# Patient Record
Sex: Male | Born: 1960
Health system: Southern US, Community
[De-identification: ages and names within clinical notes are randomized; demographics above are authoritative.]

## PROBLEM LIST (undated history)

## (undated) DIAGNOSIS — G709 Myoneural disorder, unspecified: Secondary | ICD-10-CM

## (undated) DIAGNOSIS — G4730 Sleep apnea, unspecified: Secondary | ICD-10-CM

## (undated) HISTORY — PX: KNEE ARTHROSCOPY: SUR90

## (undated) HISTORY — PX: SPINE SURGERY: SHX786

## (undated) HISTORY — DX: Myoneural disorder, unspecified: G70.9

## (undated) HISTORY — PX: COLONOSCOPY: SHX174

## (undated) HISTORY — DX: Sleep apnea, unspecified: G47.30

---

## 1998-11-15 ENCOUNTER — Encounter: Payer: Self-pay | Admitting: Neurosurgery

## 1998-11-20 ENCOUNTER — Inpatient Hospital Stay (HOSPITAL_COMMUNITY): Admission: RE | Admit: 1998-11-20 | Discharge: 1998-11-23 | Payer: Self-pay | Admitting: Neurosurgery

## 1998-11-23 ENCOUNTER — Inpatient Hospital Stay
Admission: RE | Admit: 1998-11-23 | Discharge: 1998-11-27 | Payer: Self-pay | Admitting: Physical Medicine & Rehabilitation

## 1998-11-28 ENCOUNTER — Encounter
Admission: RE | Admit: 1998-11-28 | Discharge: 1999-02-26 | Payer: Self-pay | Admitting: Physical Medicine & Rehabilitation

## 1999-03-01 ENCOUNTER — Encounter: Admission: RE | Admit: 1999-03-01 | Discharge: 1999-03-20 | Payer: Self-pay | Admitting: Orthopedic Surgery

## 1999-08-26 ENCOUNTER — Encounter: Payer: Self-pay | Admitting: Neurosurgery

## 1999-08-26 ENCOUNTER — Ambulatory Visit (HOSPITAL_COMMUNITY): Admission: RE | Admit: 1999-08-26 | Discharge: 1999-08-26 | Payer: Self-pay | Admitting: Neurosurgery

## 1999-11-16 ENCOUNTER — Ambulatory Visit (HOSPITAL_COMMUNITY): Admission: RE | Admit: 1999-11-16 | Discharge: 1999-11-16 | Payer: Self-pay

## 2006-03-18 ENCOUNTER — Encounter: Payer: Self-pay | Admitting: Neurosurgery

## 2010-12-08 ENCOUNTER — Encounter: Payer: Self-pay | Admitting: Neurosurgery

## 2011-11-10 ENCOUNTER — Ambulatory Visit (INDEPENDENT_AMBULATORY_CARE_PROVIDER_SITE_OTHER): Payer: BC Managed Care – PPO

## 2011-11-10 DIAGNOSIS — J019 Acute sinusitis, unspecified: Secondary | ICD-10-CM

## 2011-11-10 DIAGNOSIS — J209 Acute bronchitis, unspecified: Secondary | ICD-10-CM

## 2013-02-10 ENCOUNTER — Encounter: Payer: BC Managed Care – PPO | Admitting: Family Medicine

## 2013-05-11 ENCOUNTER — Other Ambulatory Visit (INDEPENDENT_AMBULATORY_CARE_PROVIDER_SITE_OTHER): Payer: BC Managed Care – PPO

## 2013-05-11 DIAGNOSIS — Z111 Encounter for screening for respiratory tuberculosis: Secondary | ICD-10-CM

## 2013-05-13 LAB — TB SKIN TEST
Induration: 0 mm
TB Skin Test: NEGATIVE

## 2015-12-02 ENCOUNTER — Ambulatory Visit (INDEPENDENT_AMBULATORY_CARE_PROVIDER_SITE_OTHER): Payer: BLUE CROSS/BLUE SHIELD | Admitting: Family Medicine

## 2015-12-02 VITALS — BP 152/90 | HR 90 | Temp 98.0°F | Resp 20 | Ht 72.44 in | Wt 264.8 lb

## 2015-12-02 DIAGNOSIS — R0683 Snoring: Secondary | ICD-10-CM

## 2015-12-02 NOTE — Progress Notes (Signed)
This chart was scribed for Robyn Haber, MD by [facility], medical scribe at [facility].The patient was seen in exam room 08 and the patient's care was started at 9:04 AM.  Patient ID: Steven Silva MRN: [id number], DOB: Feb 13, 1961, 55 y.o. Date of Encounter: 12/02/2015  Primary Physician: Crisoforo Oxford, PA-C  Chief Complaint:  Chief Complaint  Patient presents with   OTHER    lab result (sleep study)   HPI:  Steven Silva is a 55 y.o. male who presents to [facility] with concerns of possible sleep apnea. Dozes off after work around 5-6 PM. His wife has noticed that he snores and wakes during the night frequently. He does need a sleep study. He had had more acid reflux recently. Wisdom tooth extracted recently, which has caused some facial swelling and pain, taking ibuprofen for this. A sedentary job, which concerns him because of his weight gain and increased blood pressure. 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  History reviewed. No pertinent past medical history.   Home Meds: Prior to Admission medications   Not on File   Allergies: No Known Allergies  Social History   Social History   Marital Status: Single    Spouse Name: N/A   Number of Children: N/A   Years of Education: N/A   Occupational History   Not on file.   Social History Main Topics   Smoking status: Never Smoker    Smokeless tobacco: Not on file   Alcohol Use: No   Drug Use: No   Sexual Activity: Not on file   Other Topics Concern   Not on file   Social History Narrative   No narrative on file    Review of Systems: Constitutional: negative for chills, fever, night sweats, weight changes. Positive for fatigue. HEENT: negative for vision changes, hearing loss, congestion, rhinorrhea, ST, epistaxis, or sinus pressure. Cardiovascular: negative for chest pain or palpitations Respiratory: negative for  hemoptysis, wheezing, shortness of breath, or cough Abdominal: negative for abdominal pain, nausea, vomiting, diarrhea, or constipation Dermatological: negative for rash Neurologic: negative for headache, dizziness, or syncope. Positive for sleep disturbance. All other systems reviewed and are otherwise negative with the exception to those above and in the HPI.  Physical Exam: Blood pressure 152/90, pulse 90, temperature 98 F (36.7 C), temperature source Oral, resp. rate 20, height 6' 0.44" (1.84 m), weight 264 lb 12.8 oz (120.112 kg), SpO2 96 %., Body mass index is 35.48 kg/(m^2). General: Well developed, well nourished, in no acute distress. Head: Normocephalic, atraumatic, eyes without discharge, sclera non-icteric, nares are without discharge. Bilateral auditory canals clear, TM's are without perforation, pearly grey and translucent with reflective cone of light bilaterally. Oral cavity moist, posterior pharynx without exudate, erythema, peritonsillar abscess, or post nasal drip.  Neck: Supple. No thyromegaly. Full ROM. No lymphadenopathy. Lungs: Clear bilaterally to auscultation without wheezes, rales, or rhonchi. Breathing is unlabored. Heart: RRR with S1 S2. No murmurs, rubs, or gallops appreciated. Abdomen: Soft, non-tender, non-distended with normoactive bowel sounds. No hepatomegaly. No rebound/guarding. No obvious abdominal masses. Msk:  Strength and tone normal for age. Extremities/Skin: Warm and dry. No clubbing or cyanosis. No edema. No rashes or suspicious lesions. Neuro: Alert and oriented X 3. Moves all extremities spontaneously. Gait is normal. CNII-XII grossly in tact. Psych:  Responds to questions appropriately with a normal affect.    ASSESSMENT AND PLAN:  55 y.o. year old  male with   By signing my name below, I, Nadim Abuhashem, attest that this documentation has been prepared under the direction and in the presence of Robyn Haber, MD.  Electronically Signed: Lora Havens, medical scribe. 12/02/2015 9:28 AM.  This chart was scribed in my presence and reviewed by me personally.    ICD-9-CM ICD-10-CM   1. Snoring 786.09 R06.83 Split night study     Signed, Robyn Haber, MD

## 2016-01-12 ENCOUNTER — Inpatient Hospital Stay (HOSPITAL_COMMUNITY): Payer: BLUE CROSS/BLUE SHIELD | Admitting: Anesthesiology

## 2016-01-12 ENCOUNTER — Ambulatory Visit (INDEPENDENT_AMBULATORY_CARE_PROVIDER_SITE_OTHER): Payer: BLUE CROSS/BLUE SHIELD | Admitting: Emergency Medicine

## 2016-01-12 ENCOUNTER — Inpatient Hospital Stay (HOSPITAL_COMMUNITY)
Admission: EM | Admit: 2016-01-12 | Discharge: 2016-01-14 | DRG: 603 | Disposition: A | Discharge status: Home Health | Payer: BLUE CROSS/BLUE SHIELD | Attending: Surgery | Admitting: Surgery

## 2016-01-12 ENCOUNTER — Encounter (HOSPITAL_COMMUNITY): Admission: EM | Disposition: A | Discharge status: Home Health | Payer: Self-pay | Source: Home / Self Care

## 2016-01-12 ENCOUNTER — Encounter (HOSPITAL_COMMUNITY): Payer: Self-pay | Admitting: Emergency Medicine

## 2016-01-12 ENCOUNTER — Emergency Department (HOSPITAL_COMMUNITY): Payer: BLUE CROSS/BLUE SHIELD

## 2016-01-12 VITALS — BP 142/90 | HR 91 | Temp 98.7°F | Resp 12 | Ht 73.0 in | Wt 258.0 lb

## 2016-01-12 DIAGNOSIS — K612 Anorectal abscess: Secondary | ICD-10-CM | POA: Diagnosis not present

## 2016-01-12 DIAGNOSIS — L0231 Cutaneous abscess of buttock: Secondary | ICD-10-CM | POA: Diagnosis present

## 2016-01-12 DIAGNOSIS — S3662XA Contusion of rectum, initial encounter: Secondary | ICD-10-CM

## 2016-01-12 HISTORY — PX: INCISION AND DRAINAGE ABSCESS: SHX5864

## 2016-01-12 LAB — POCT CBC
Granulocyte percent: 71.6 %G (ref 37–80)
HCT, POC: 14.7 % — AB (ref 43.5–53.7)
Hemoglobin: 14.5 g/dL (ref 14.1–18.1)
Lymph, poc: 2.4 (ref 0.6–3.4)
MCH, POC: 30.7 pg (ref 27–31.2)
MCHC: 34.8 g/dL (ref 31.8–35.4)
MCV: 88.1 fL (ref 80–97)
MID (cbc): 1.2 — AB (ref 0–0.9)
MPV: 7.4 fL (ref 0–99.8)
POC Granulocyte: 9 — AB (ref 2–6.9)
POC LYMPH PERCENT: 18.9 %L (ref 10–50)
POC MID %: 9.5 %M (ref 0–12)
Platelet Count, POC: 198 10*3/uL (ref 142–424)
RBC: 4.73 M/uL (ref 4.69–6.13)
RDW, POC: 13 %
WBC: 12.6 10*3/uL — AB (ref 4.6–10.2)

## 2016-01-12 LAB — CBC WITH DIFFERENTIAL/PLATELET
BASOS ABS: 0 10*3/uL (ref 0.0–0.1)
Basophils Relative: 0 %
Eosinophils Absolute: 0.2 10*3/uL (ref 0.0–0.7)
Eosinophils Relative: 2 %
HEMATOCRIT: 40.2 % (ref 39.0–52.0)
HEMOGLOBIN: 13.8 g/dL (ref 13.0–17.0)
LYMPHS PCT: 14 %
Lymphs Abs: 1.8 10*3/uL (ref 0.7–4.0)
MCH: 30.8 pg (ref 26.0–34.0)
MCHC: 34.3 g/dL (ref 30.0–36.0)
MCV: 89.7 fL (ref 78.0–100.0)
MONO ABS: 1.2 10*3/uL — AB (ref 0.1–1.0)
MONOS PCT: 10 %
NEUTROS ABS: 9.3 10*3/uL — AB (ref 1.7–7.7)
NEUTROS PCT: 74 %
Platelets: 213 10*3/uL (ref 150–400)
RBC: 4.48 MIL/uL (ref 4.22–5.81)
RDW: 12.3 % (ref 11.5–15.5)
WBC: 12.5 10*3/uL — ABNORMAL HIGH (ref 4.0–10.5)

## 2016-01-12 LAB — BASIC METABOLIC PANEL
ANION GAP: 10 (ref 5–15)
BUN: 16 mg/dL (ref 6–20)
CALCIUM: 9 mg/dL (ref 8.9–10.3)
CHLORIDE: 104 mmol/L (ref 101–111)
CO2: 23 mmol/L (ref 22–32)
Creatinine, Ser: 1.08 mg/dL (ref 0.61–1.24)
GFR calc Af Amer: >60 mL/min (ref >60)
GFR calc non Af Amer: >60 mL/min (ref >60)
GLUCOSE: 94 mg/dL (ref 65–99)
Potassium: 3.8 mmol/L (ref 3.5–5.1)
Sodium: 137 mmol/L (ref 135–145)

## 2016-01-12 LAB — I-STAT CG4 LACTIC ACID, ED: Lactic Acid, Venous: 0.96 mmol/L (ref 0.5–2.0)

## 2016-01-12 LAB — GLUCOSE, POCT (MANUAL RESULT ENTRY): POC Glucose: 96 mg/dl (ref 70–99)

## 2016-01-12 SURGERY — INCISION AND DRAINAGE, ABSCESS
Anesthesia: General | Laterality: Left

## 2016-01-12 MED ORDER — HYDROMORPHONE HCL 1 MG/ML IJ SOLN: 0.2500 mg | INTRAMUSCULAR | Status: DC | PRN

## 2016-01-12 MED ORDER — BUPIVACAINE-EPINEPHRINE 0.25% -1:200000 IJ SOLN
INTRAMUSCULAR | Status: DC | PRN
  Administered 2016-01-12: 30 mL

## 2016-01-12 MED ORDER — ONDANSETRON HCL 4 MG/2ML IJ SOLN
INTRAMUSCULAR | Status: AC
  Filled 2016-01-12: qty 2

## 2016-01-12 MED ORDER — PIPERACILLIN-TAZOBACTAM 3.375 G IVPB
3.3750 g | Freq: Three times a day (TID) | INTRAVENOUS | Status: DC
  Administered 2016-01-12 – 2016-01-14 (×5): 3.375 g via INTRAVENOUS
  Filled 2016-01-12 (×8): qty 50

## 2016-01-12 MED ORDER — BUPIVACAINE-EPINEPHRINE (PF) 0.25% -1:200000 IJ SOLN
INTRAMUSCULAR | Status: AC
  Filled 2016-01-12: qty 30

## 2016-01-12 MED ORDER — PROPOFOL 10 MG/ML IV BOLUS
INTRAVENOUS | Status: DC | PRN
  Administered 2016-01-12: 200 mg via INTRAVENOUS
  Administered 2016-01-12: 150 mg via INTRAVENOUS

## 2016-01-12 MED ORDER — OXYCODONE HCL 5 MG PO TABS: 5.0000 mg | ORAL_TABLET | Freq: Once | ORAL | Status: DC | PRN

## 2016-01-12 MED ORDER — LACTATED RINGERS IV SOLN
INTRAVENOUS | Status: DC | PRN
  Administered 2016-01-12: 22:00:00 via INTRAVENOUS

## 2016-01-12 MED ORDER — PROPOFOL 10 MG/ML IV BOLUS
INTRAVENOUS | Status: AC
  Filled 2016-01-12: qty 20

## 2016-01-12 MED ORDER — SODIUM CHLORIDE 0.9 % IV BOLUS (SEPSIS)
1000.0000 mL | Freq: Once | INTRAVENOUS | Status: AC
  Administered 2016-01-12: 1000 mL via INTRAVENOUS

## 2016-01-12 MED ORDER — VANCOMYCIN HCL IN DEXTROSE 1-5 GM/200ML-% IV SOLN
1000.0000 mg | Freq: Once | INTRAVENOUS | Status: AC
  Administered 2016-01-12: 1000 mg via INTRAVENOUS
  Filled 2016-01-12: qty 200

## 2016-01-12 MED ORDER — FENTANYL CITRATE (PF) 100 MCG/2ML IJ SOLN
INTRAMUSCULAR | Status: AC
  Filled 2016-01-12: qty 2

## 2016-01-12 MED ORDER — 0.9 % SODIUM CHLORIDE (POUR BTL) OPTIME
TOPICAL | Status: DC | PRN
  Administered 2016-01-12: 1000 mL

## 2016-01-12 MED ORDER — PROMETHAZINE HCL 25 MG/ML IJ SOLN: 6.2500 mg | INTRAMUSCULAR | Status: DC | PRN

## 2016-01-12 MED ORDER — CEFAZOLIN SODIUM 1-5 GM-% IV SOLN
1.0000 g | Freq: Once | INTRAVENOUS | Status: AC
  Administered 2016-01-12: 1 g via INTRAVENOUS
  Filled 2016-01-12: qty 50

## 2016-01-12 MED ORDER — LIDOCAINE HCL (CARDIAC) 20 MG/ML IV SOLN
INTRAVENOUS | Status: AC
Start: 2016-01-12 — End: 2016-01-12
  Filled 2016-01-12: qty 5

## 2016-01-12 MED ORDER — FENTANYL CITRATE (PF) 100 MCG/2ML IJ SOLN
INTRAMUSCULAR | Status: DC | PRN
  Administered 2016-01-12 (×2): 50 ug via INTRAVENOUS

## 2016-01-12 MED ORDER — LIDOCAINE HCL (CARDIAC) 20 MG/ML IV SOLN
INTRAVENOUS | Status: DC | PRN
  Administered 2016-01-12: 100 mg via INTRATRACHEAL

## 2016-01-12 MED ORDER — IOHEXOL 300 MG/ML  SOLN
100.0000 mL | Freq: Once | INTRAMUSCULAR | Status: AC | PRN
  Administered 2016-01-12: 100 mL via INTRAVENOUS

## 2016-01-12 MED ORDER — OXYCODONE HCL 5 MG/5ML PO SOLN
5.0000 mg | Freq: Once | ORAL | Status: DC | PRN
  Filled 2016-01-12: qty 5

## 2016-01-12 MED ORDER — ONDANSETRON HCL 4 MG/2ML IJ SOLN
INTRAMUSCULAR | Status: DC | PRN
  Administered 2016-01-12: 4 mg via INTRAVENOUS

## 2016-01-12 SURGICAL SUPPLY — 27 items
BNDG GAUZE ELAST 4 BULKY (GAUZE/BANDAGES/DRESSINGS) IMPLANT
COVER SURGICAL LIGHT HANDLE (MISCELLANEOUS) ×3 IMPLANT
DECANTER SPIKE VIAL GLASS SM (MISCELLANEOUS) IMPLANT
DRAPE LAPAROSCOPIC ABDOMINAL (DRAPES) IMPLANT
DRSG PAD ABDOMINAL 8X10 ST (GAUZE/BANDAGES/DRESSINGS) IMPLANT
ELECT REM PT RETURN 9FT ADLT (ELECTROSURGICAL) ×3
ELECTRODE REM PT RTRN 9FT ADLT (ELECTROSURGICAL) ×1 IMPLANT
GAUZE PACKING IODOFORM 2 (PACKING) ×2 IMPLANT
GAUZE SPONGE 4X4 12PLY STRL (GAUZE/BANDAGES/DRESSINGS) ×2 IMPLANT
GLOVE BIO SURGEON STRL SZ7.5 (GLOVE) ×3 IMPLANT
GLOVE ECLIPSE 7.5 STRL STRAW (GLOVE) ×3 IMPLANT
GOWN STRL REUS W/TWL XL LVL3 (GOWN DISPOSABLE) ×6 IMPLANT
KIT BASIN OR (CUSTOM PROCEDURE TRAY) ×3 IMPLANT
NDL HYPO 25X1 1.5 SAFETY (NEEDLE) IMPLANT
NEEDLE HYPO 25X1 1.5 SAFETY (NEEDLE) IMPLANT
NS IRRIG 1000ML POUR BTL (IV SOLUTION) ×3 IMPLANT
PACK GENERAL/GYN (CUSTOM PROCEDURE TRAY) ×3 IMPLANT
SPONGE LAP 18X18 X RAY DECT (DISPOSABLE) IMPLANT
SUT MNCRL AB 4-0 PS2 18 (SUTURE) IMPLANT
SUT VIC AB 3-0 SH 27 (SUTURE)
SUT VIC AB 3-0 SH 27XBRD (SUTURE) IMPLANT
SWAB COLLECTION DEVICE MRSA (MISCELLANEOUS) IMPLANT
SWAB CULTURE ESWAB REG 1ML (MISCELLANEOUS) IMPLANT
SYR CONTROL 10ML LL (SYRINGE) IMPLANT
TAPE CLOTH SURG 4X10 WHT LF (GAUZE/BANDAGES/DRESSINGS) ×2 IMPLANT
TOWEL OR 17X26 10 PK STRL BLUE (TOWEL DISPOSABLE) ×3 IMPLANT
TOWEL OR NON WOVEN STRL DISP B (DISPOSABLE) IMPLANT

## 2016-01-12 NOTE — Progress Notes (Signed)
Pt arrived from ED.  I received report from nurse, both verbally and in a note. Nurse reported and note said pt had Vanc and Zosyn in ED.  I asked nurse when pt would be having surgery, and she said tomorrow am. Within 2 minutes of pt arriving on floor, OR called and said they were ready for the pt. They asked if the consent was in the chart, I checked and said no, but I would have pt sign it and send him down as soon as possible. About an hour later, OR nurse called and asked it pt had Vanc and Zosyn in ED. She said the pt stated he had both, but it the Zosyn was not documented on the [facility].  I told nurse I had been told verbally and in writing that he had both the Vanc and Zosyn, but she probably needed to call the ED nurse or pharmacy to be sure. A bit later, Dr. Abran Cantor called the CN Hart Carwin to inquire if the pt had in fact received the Zosyn.  She did not know anymore than I, but advised him to call the CN in ED try to see when what meds were pulled for pt since the Zosyn was not documented as given.  A while later, ED CN called to let med know that pt had in fact received Ancef while in ED, not Zosyn.

## 2016-01-12 NOTE — Patient Instructions (Signed)
I have called [facility] emergency room. Please  go there for evaluation.

## 2016-01-12 NOTE — Anesthesia Postprocedure Evaluation (Signed)
Anesthesia Post Note  Patient: Steven Silva  Procedure(s) Performed: Procedure(s) (LRB): INCISION AND DRAINAGE ABSCESS buttock abscess (Right)  Patient location during evaluation: PACU Anesthesia Type: General Level of consciousness: awake and alert Pain management: pain level controlled Vital Signs Assessment: post-procedure vital signs reviewed and stable Respiratory status: spontaneous breathing, nonlabored ventilation, respiratory function stable and patient connected to nasal cannula oxygen Cardiovascular status: blood pressure returned to baseline and stable Postop Assessment: no signs of nausea or vomiting Anesthetic complications: no    Last Vitals:  Filed Vitals:   01/12/16 2245 01/12/16 2300  BP: 139/92 145/97  Pulse: 78 76  Temp: 36.7 C   Resp: 15 12    Last Pain:  Filed Vitals:   01/12/16 2302  PainSc: 1                  Zenaida Deed

## 2016-01-12 NOTE — Op Note (Signed)
Preoperative Diagnosis: Left buttock abscess  Postoprative Diagnosis: Same   Procedure: Procedure(s): INCISION AND DRAINAGE ABSCESS left buttock   Surgeon: [name] T   Assistants: none  Anesthesia:  General endotracheal anesthesia  Indications: patient is a 55 year old male who injured his left buttock sitting on a liquid rail fence about 4 days prior to presentation. Today he noted swelling and foul-smelling drainage and presented for evaluation. He is noted to have evidence of a soft tissue infection in the left buttock near the anus and CT scan shows a large collection of gas and fluid in the left buttock. I recommended emergency incision and drainage and the patient is in agreement.  Procedure Detail:  Patient was brought to the operating room, placed in supine position on the operating table, and general endotracheal anesthesia induced. He was carefully positioned in lithotomy position. The buttocks were widely sterilely prepped and draped. Patient timeout was performed. Procedure verified. He was already on broad-spectrum IV antibiotics. He was noted to be about a 1 cm wound in the left buttock several centimeters from the anus and just posterior. There was foul-smelling reddish purulent drainage. This wound was dilated and further drainage obtained and cultured. Digital palpation revealed a cavity extending a significant distance up along the left perirectal space. This wound was extended out laterally and opened up with cautery. The cavity was opened as much as possible. It actually extended about 12 cm proximally along the perirectal space. A moderate amount of very foul-smelling drainage was obtained. All loculations were broken up. I did not see any necrotic tissue from what I was able to examine through the wound. Following this the wound was thoroughly irrigated. The cavity was then packed with 5 yards of 1 inch iodoform gauze. There was no bleeding. The soft tissue was  infiltrated with Marcaine. Sponge needle and instrument counts were correct. Clean gauze dressing was applied.    Findings: As above  Estimated Blood Loss:  Minimal         Drains: wound packed open with one-inch iodoform gauze  Blood Given: none          Specimens: culture and sensitivity        Complications:  * No complications entered in OR log *         Disposition: PACU - hemodynamically stable.         Condition: stable

## 2016-01-12 NOTE — Anesthesia Procedure Notes (Signed)
Procedure Name: LMA Insertion Date/Time: 01/12/2016 10:00 PM Performed by: [place] Quinnetta Roepke C Pre-anesthesia Checklist: Patient identified, Timeout performed, Emergency Drugs available, Suction available and Patient being monitored Patient Re-evaluated:Patient Re-evaluated prior to inductionOxygen Delivery Method: Circle system utilized Intubation Type: IV induction LMA: LMA inserted LMA Size: 5.0 Number of attempts: 1 Placement Confirmation: positive ETCO2,  breath sounds checked- equal and bilateral and CO2 detector Tube secured with: Tape Dental Injury: Teeth and Oropharynx as per pre-operative assessment

## 2016-01-12 NOTE — Transfer of Care (Signed)
Immediate Anesthesia Transfer of Care Note  Patient: Steven Silva  Procedure(s) Performed: Procedure(s): INCISION AND DRAINAGE ABSCESS buttock abscess (Right)  Patient Location: PACU  Anesthesia Type:General  Level of Consciousness: awake, alert  and oriented  Airway & Oxygen Therapy: Patient Spontanous Breathing and Patient connected to face mask oxygen  Post-op Assessment: Report given to RN and Post -op Vital signs reviewed and stable  Post vital signs: Reviewed and stable  Last Vitals:  Filed Vitals:   01/12/16 1957 01/12/16 2110  BP: 137/88 126/80  Pulse: 80 82  Temp:    Resp: 18 20    Complications: No apparent anesthesia complications

## 2016-01-12 NOTE — ED Provider Notes (Signed)
CSN: [id number]     Arrival date & time 01/12/16  1444 History   First MD Initiated Contact with Patient 01/12/16 1526     Chief Complaint  Patient presents with  . Abscess  . Rectal Pain   LAVARIS MELLY is a 55 y.o. male Who presents to the emergency department complaining of an abscess to his left buttocks for the past 5 days. She reports that 5 days ago he tripped and fell backwards onto a fence and sustained an injury to his left buttocks. He reports he has developed pain, swelling and discharge from the area. He reports it has been weeping. He reports fever last night with a temperature of 102 at home. He denies any current pain. He has had nothing for treatment today. He saw his primary care provider Dr. Everlene Farrier who sent him to the emergency department. The patient had an elevated white count on CBC at his primary care provider's office. Patient reports last tetanus was 3 years ago. The patient denies abdominal pain, nausea, vomiting, diarrhea, hematochezia, cough, shortness of breath, urinary symptoms, back pain, numbness, tingling or weakness.  The history is provided by the patient. No language interpreter was used.    History reviewed. No pertinent past medical history. Past Surgical History  Procedure Laterality Date  . Spine surgery     History reviewed. No pertinent family history. Social History  Substance Use Topics  . Smoking status: Never Smoker   . Smokeless tobacco: None  . Alcohol Use: No    Review of Systems  Constitutional: Positive for fever and chills.  HENT: Negative for sore throat.   Eyes: Negative for visual disturbance.  Respiratory: Negative for cough and shortness of breath.   Cardiovascular: Negative for chest pain.  Gastrointestinal: Negative for nausea, vomiting, abdominal pain and diarrhea.  Genitourinary: Negative for dysuria, urgency, hematuria and difficulty urinating.  Musculoskeletal: Negative for back pain and neck pain.  Skin: Positive for  color change, rash and wound.  Neurological: Negative for weakness, numbness and headaches.      Allergies  Review of patient's allergies indicates no known allergies.  Home Medications   Prior to Admission medications   Not on File   BP 128/78 mmHg  Pulse 80  Temp(Src) 98.2 F (36.8 C) (Oral)  Resp 14  Ht 6\' 1"  (1.854 m)  Wt 115.214 kg  BMI 33.52 kg/m2  SpO2 95% Physical Exam  Constitutional: He appears well-developed and well-nourished. No distress.  HENT:  Head: Normocephalic and atraumatic.  Mouth/Throat: Oropharynx is clear and moist.  Eyes: Right eye exhibits no discharge. Left eye exhibits no discharge.  Neck: Neck supple.  Pulmonary/Chest: Effort normal and breath sounds normal. No respiratory distress.  Abdominal: Soft. There is no tenderness.  Genitourinary:  Large fluctuant abscess to his left buttocks that extends up to his gluteal cleft. with some malodorus purulent drainage with surrounding cellulitis and ecchymosis. See picture in chart.   Musculoskeletal: He exhibits no edema.  Lymphadenopathy:    He has no cervical adenopathy.  Neurological: He is alert. Coordination normal.  Skin: Skin is warm and dry. No rash noted. He is not diaphoretic. There is erythema. No pallor.  Psychiatric: He has a normal mood and affect. His behavior is normal.  Nursing note and vitals reviewed.     ED Course  Procedures (including critical care time) Labs Review Labs Reviewed  CBC WITH DIFFERENTIAL/PLATELET - Abnormal; Notable for the following:    WBC 12.5 (*)  Neutro Abs 9.3 (*)    Monocytes Absolute 1.2 (*)    All other components within normal limits  CULTURE, BLOOD (ROUTINE X 2)  CULTURE, BLOOD (ROUTINE X 2)  CULTURE, ROUTINE-ABSCESS  BASIC METABOLIC PANEL  I-STAT CG4 LACTIC ACID, ED    Imaging Review Ct Pelvis W Contrast  01/12/2016  CLINICAL DATA:  Buttock abscess. Set on metal thin seen Tuesday. Swelling and drainage. EXAM: CT PELVIS WITH CONTRAST  TECHNIQUE: Multidetector CT imaging of the pelvis was performed using the standard protocol following the bolus administration of intravenous contrast. CONTRAST:  140mL OMNIPAQUE IOHEXOL 300 MG/ML  SOLN COMPARISON:  None. FINDINGS: There is gas and soft tissue stranding noted within the right medial buttock subcutaneous soft tissues near the anus and rectum. This is not well-defined but the and flank area measures approximately 7 x 4.6 cm. Visualized large and small bowel are unremarkable. Aorta and iliac vessels are normal caliber. Urinary bladder is unremarkable. No free fluid, free air or adenopathy. No acute bony abnormality. Degenerative changes in the lower lumbar spine. IMPRESSION: Inflammatory stranding and locules of gas noted within the medial left buttock subcutaneous soft tissues near the anus rectum. No fluid within this area and no well-defined walls. This inflamed area measures up to approximately 7 cm. Electronically Signed   By: Rolm Baptise M.D.   On: 01/12/2016 17:49   I have personally reviewed and evaluated these images and lab results as part of my medical decision-making.   EKG Interpretation None      Filed Vitals:   01/12/16 2342 01/13/16 0046 01/13/16 0149 01/13/16 0246  BP: 132/87 124/89 147/81 128/78  Pulse: 77 77 80 80  Temp: 98.5 F (36.9 C) 98.2 F (36.8 C) 98.2 F (36.8 C) 98.2 F (36.8 C)  TempSrc:  Oral Oral Oral  Resp: 16 14 14 14   Height:  6\' 1"  (1.854 m)    Weight:  115.214 kg    SpO2: 98% 98% 98% 95%     MDM   Meds given in ED:  Medications  morphine 2 MG/ML injection 2-4 mg (not administered)  piperacillin-tazobactam (ZOSYN) IVPB 3.375 g ( Intravenous MAR Unhold 01/12/16 2353)  oxyCODONE-acetaminophen (PERCOCET/ROXICET) 5-325 MG per tablet 1-2 tablet (not administered)  vancomycin (VANCOCIN) IVPB 1000 mg/200 mL premix (0 mg Intravenous Stopped 01/12/16 1900)  ceFAZolin (ANCEF) IVPB 1 g/50 mL premix (0 g Intravenous Stopped 01/12/16 1700)  sodium  chloride 0.9 % bolus 1,000 mL (0 mLs Intravenous Stopped 01/12/16 1728)  iohexol (OMNIPAQUE) 300 MG/ML solution 100 mL (100 mLs Intravenous Contrast Given 01/12/16 1729)    There are no discharge medications for this patient.   Final diagnoses:  Abscess of buttock, left   This is a 55 y.o. male Who presents to the emergency department complaining of an abscess to his left buttocks for the past 5 days. She reports that 5 days ago he tripped and fell backwards onto a fence and sustained an injury to his left buttocks. He reports he has developed pain, swelling and discharge from the area. He reports it has been weeping. He reports fever last night with a temperature of 102 at home. On exam the patient is afebrile nontoxic appearing. He has a large indurated fluctuant abscess to his left buttocks. This is weeping some brown purulent material. Will obtain CT pelvis, then you start on IV antibiotics and obtain blood work and blood cultures. Patient with a leukocytosis with a white count of 12,000. CT pelvis with contrast indicated inflammatory  stranding and locules of gas noted within the medial left buttocks subcutaneous soft tissues near the anus and rectum. The inflamed areas approximately 7 cm. I consulted with general surgeon Dr. [name] who plans to take the patient to the OR for incision and drainage and washout admission.  Patient to OR with Dr. [name].      Waynetta Pean, PA-C 01/13/16 0300  Lacretia Leigh, MD 01/13/16 [phone]

## 2016-01-12 NOTE — ED Notes (Signed)
General surg at bedside

## 2016-01-12 NOTE — Progress Notes (Signed)
Chief Complaint:  Chief Complaint  Patient presents with  . Abrasion    sat on a fence on tuesday, started having drainage this morning, fever on friday    HPI: Steven Silva is a 55 y.o. male who is here for check of his left buttocks. Apparently on Tuesday night patient slipped and fell back landing on a metal portion of a fence. This punctured through his jeans and underwear. He had some bleeding at the time but did not seek medical attention.Marland Kitchen He did not seek treatment for this.He  is up-to-date on tetanus yesterday evening he noticed increasing pain started noticing some drainage from the area. This morning her has been an odor to the drainage.  History reviewed. No pertinent past medical history. Past Surgical History  Procedure Laterality Date  . Spine surgery     Social History   Social History  . Marital Status: Single    Spouse Name: N/A  . Number of Children: N/A  . Years of Education: N/A   Social History Main Topics  . Smoking status: Never Smoker   . Smokeless tobacco: None  . Alcohol Use: No  . Drug Use: No  . Sexual Activity: Not Asked   Other Topics Concern  . None   Social History Narrative   History reviewed. No pertinent family history. No Known Allergies Prior to Admission medications   Not on File     ROS: The patient denies fevers, chills, night sweats, unintentional weight loss, chest pain, palpitations, wheezing, dyspnea on exertion, nausea, vomiting, abdominal pain, dysuria, hematuria, melena, numbness, weakness, or tingling.   All other systems have been reviewed and were otherwise negative with the exception of those mentioned in the HPI and as above.    PHYSICAL EXAM: Filed Vitals:   01/12/16 1234  BP: 142/90  Pulse: 91  Temp: 98.7 F (37.1 C)  Resp: 12   Filed Vitals:   01/12/16 1234  Height: 6\' 1"  (1.854 m)  Weight: 258 lb (117.028 kg)   Body mass index is 34.05 kg/(m^2).   General: Alert, no acute  distress HEENT:  Normocephalic, atraumatic, oropharynx patent. EOMI, PERRLA Cardiovascular:  Regular rate and rhythm, no rubs murmurs or gallops.  No Carotid bruits, radial pulse intact. No pedal edema.  Respiratory: Clear to auscultation bilaterally.  No wheezes, rales, or rhonchi.  No cyanosis, no use of accessory musculature GI: No organomegaly, abdomen is soft and non-tender, positive bowel sounds.  No masses. Skin: No rashes. Neurologic: Facial musculature symmetric. Psychiatric: Patient is appropriate throughout our interaction. Lymphatic: No cervical lymphadenopathy Musculoskeletal: Gait intact. Lateral to the anus there is a 3 cm laceration with purulent drainage. Surrounding this wound is approximately 6-8 cm of indurated tissue with bruising.  LABS: Results for orders placed or performed in visit on 01/12/16  POCT CBC  Result Value Ref Range   WBC 12.6 (A) 4.6 - 10.2 K/uL   Lymph, poc 2.4 0.6 - 3.4   POC LYMPH PERCENT 18.9 10 - 50 %L   MID (cbc) 1.2 (A) 0 - 0.9   POC MID % 9.5 0 - 12 %M   POC Granulocyte 9.0 (A) 2 - 6.9   Granulocyte percent 71.6 37 - 80 %G   RBC 4.73 4.69 - 6.13 M/uL   Hemoglobin 14.5 14.1 - 18.1 g/dL   HCT, POC 14.7 (A) 43.5 - 53.7 %   MCV 88.1 80 - 97 fL   MCH, POC 30.7 27 - 31.2 pg  MCHC 34.8 31.8 - 35.4 g/dL   RDW, POC 13.0 %   Platelet Count, POC 198 142 - 424 K/uL   MPV 7.4 0 - 99.8 fL  POCT glucose (manual entry)  Result Value Ref Range   POC Glucose 96 70 - 99 mg/dl     EKG/XRAY:   Primary read interpreted by Dr. Everlene Farrier at [facility].   ASSESSMENT/PLAN: 1. Hematoma of the left medial buttocks, initial encounter Patient will be referred to [facility] emergency room for CT scanning of the pelvis, IV antibiotics, and surgical evaluation. - POCT CBC - POCT glucose (manual entry) - Wound culture  2. Abscess of anal and rectal regions See above patient states he did have a tetanus vaccination 3 years ago but it is not in the record. - POCT  CBC - POCT glucose (manual entry) - Wound culture     Gross sideeffects, risk and benefits, and alternatives of medications d/w patient. Patient is aware that all medications have potential sideeffects and we are unable to predict every sideeffect or drug-drug interaction that may occur.  @[facility] @ 01/12/2016 1:28 PM

## 2016-01-12 NOTE — ED Notes (Signed)
Patient transported to CT 

## 2016-01-12 NOTE — Anesthesia Preprocedure Evaluation (Addendum)
Anesthesia Evaluation  Patient identified by MRN, date of birth, ID band Patient awake    Reviewed: Allergy & Precautions, H&P , NPO status , Patient's Chart, lab work & pertinent test results  History of Anesthesia Complications Negative for: history of anesthetic complications  Airway Mallampati: II  TM Distance: >3 FB Neck ROM: full    Dental no notable dental hx.    Pulmonary neg pulmonary ROS,    Pulmonary exam normal breath sounds clear to auscultation       Cardiovascular negative cardio ROS Normal cardiovascular exam Rhythm:regular Rate:Normal     Neuro/Psych negative neurological ROS     GI/Hepatic negative GI ROS, Neg liver ROS,   Endo/Other  obesity  Renal/GU negative Renal ROS     Musculoskeletal   Abdominal   Peds  Hematology negative hematology ROS (+)   Anesthesia Other Findings Previously had cervical fusion, does have good range of motion with extension, good mouth opening and thyro mental distance  Reproductive/Obstetrics negative OB ROS                            Anesthesia Physical Anesthesia Plan  ASA: II  Anesthesia Plan: General   Post-op Pain Management:    Induction: Intravenous  Airway Management Planned: Oral ETT  Additional Equipment:   Intra-op Plan:   Post-operative Plan: Extubation in OR  Informed Consent: I have reviewed the patients History and Physical, chart, labs and discussed the procedure including the risks, benefits and alternatives for the proposed anesthesia with the patient or authorized representative who has indicated his/her understanding and acceptance.   Dental Advisory Given  Plan Discussed with: Anesthesiologist, CRNA and Surgeon  Anesthesia Plan Comments:         Anesthesia Quick Evaluation

## 2016-01-12 NOTE — ED Notes (Signed)
Pt sat back on piece of metal fencing on Tuesday. Thought it was just an abrasion, but swelling and drainage got worse. Went to UC who found a 3" laceration and an orange sized abscess. Pt reports abscess is close to rectal area. Pt reports UC sent off a wound culture, last tetanus 3 years ago.

## 2016-01-12 NOTE — ED Notes (Signed)
Patient presents to ED after falling on a fence late Tuesday night. Patient went to PCP for evaluation of injury today and was referred to ED due to infection. Patient reports increased smell and "weeping" that is a "reddish-brown in color".

## 2016-01-12 NOTE — H&P (Signed)
Steven Silva is an 55 y.o. male.   Chief Complaint: Buttock injury and drainage  HPI: patient states  That 4 days ago he sat down hard on a wooden branch and noticed that he injured his right buttock.He initially noted some bleeding but cleaned and bandaged. Area and it seemed okay. He has not really had much pain. However he began to have very foul smelling reddish brown drainage from the wound today. He was seen and evaluated at urgent care and sent to the emergency department. CT scan was obtained as below. He denies fever or chills. Minimal discomfort.  History reviewed. No pertinent past medical history.  Past Surgical History  Procedure Laterality Date  . Spine surgery      History reviewed. No pertinent family history. Social History:  reports that he has never smoked. He does not have any smokeless tobacco history on file. He reports that he does not drink alcohol or use illicit drugs.  Allergies: No Known Allergies  No current facility-administered medications for this encounter.   No current outpatient prescriptions on file.     Results for orders placed or performed during the hospital encounter of 01/12/16 (from the past 48 hour(s))  CBC with Differential     Status: Abnormal   Collection Time: 01/12/16  4:16 PM  Result Value Ref Range   WBC 12.5 (H) 4.0 - 10.5 K/uL   RBC 4.48 4.22 - 5.81 MIL/uL   Hemoglobin 13.8 13.0 - 17.0 g/dL   HCT 40.2 39.0 - 52.0 %   MCV 89.7 78.0 - 100.0 fL   MCH 30.8 26.0 - 34.0 pg   MCHC 34.3 30.0 - 36.0 g/dL   RDW 12.3 11.5 - 15.5 %   Platelets 213 150 - 400 K/uL   Neutrophils Relative % 74 %   Neutro Abs 9.3 (H) 1.7 - 7.7 K/uL   Lymphocytes Relative 14 %   Lymphs Abs 1.8 0.7 - 4.0 K/uL   Monocytes Relative 10 %   Monocytes Absolute 1.2 (H) 0.1 - 1.0 K/uL   Eosinophils Relative 2 %   Eosinophils Absolute 0.2 0.0 - 0.7 K/uL   Basophils Relative 0 %   Basophils Absolute 0.0 0.0 - 0.1 K/uL  Basic metabolic panel     Status: None    Collection Time: 01/12/16  4:16 PM  Result Value Ref Range   Sodium 137 135 - 145 mmol/L   Potassium 3.8 3.5 - 5.1 mmol/L   Chloride 104 101 - 111 mmol/L   CO2 23 22 - 32 mmol/L   Glucose, Bld 94 65 - 99 mg/dL   BUN 16 6 - 20 mg/dL   Creatinine, Ser 1.08 0.61 - 1.24 mg/dL   Calcium 9.0 8.9 - 10.3 mg/dL   GFR calc non Af Amer >60 >60 mL/min   GFR calc Af Amer >60 >60 mL/min    Comment: (NOTE) The eGFR has been calculated using the CKD EPI equation. This calculation has not been validated in all clinical situations. eGFR's persistently <60 mL/min signify possible Chronic Kidney Disease.    Anion gap 10 5 - 15  I-Stat CG4 Lactic Acid, ED     Status: None   Collection Time: 01/12/16  4:32 PM  Result Value Ref Range   Lactic Acid, Venous 0.96 0.5 - 2.0 mmol/L   Ct Pelvis W Contrast  01/12/2016  CLINICAL DATA:  Buttock abscess. Set on metal thin seen Tuesday. Swelling and drainage. EXAM: CT PELVIS WITH CONTRAST TECHNIQUE: Multidetector CT imaging  of the pelvis was performed using the standard protocol following the bolus administration of intravenous contrast. CONTRAST:  151m OMNIPAQUE IOHEXOL 300 MG/ML  SOLN COMPARISON:  None. FINDINGS: There is gas and soft tissue stranding noted within the right medial buttock subcutaneous soft tissues near the anus and rectum. This is not well-defined but the and flank area measures approximately 7 x 4.6 cm. Visualized large and small bowel are unremarkable. Aorta and iliac vessels are normal caliber. Urinary bladder is unremarkable. No free fluid, free air or adenopathy. No acute bony abnormality. Degenerative changes in the lower lumbar spine. IMPRESSION: Inflammatory stranding and locules of gas noted within the medial left buttock subcutaneous soft tissues near the anus rectum. No fluid within this area and no well-defined walls. This inflamed area measures up to approximately 7 cm. Electronically Signed   By: KRolm BaptiseM.D.   On: 01/12/2016 17:49     Review of Systems  Constitutional: Negative for fever and chills.  Respiratory: Negative for shortness of breath.   Cardiovascular: Negative for chest pain and palpitations.  Gastrointestinal: Negative for nausea, vomiting and abdominal pain.    Blood pressure 129/83, pulse 81, temperature 98.2 F (36.8 C), temperature source Oral, resp. rate 18, SpO2 98 %. Physical Exam   general: Alert and comfortable, no distress HEENT: Sclerae nonictericpupils reactive Lungs: No wheezing or increased work of breathing Cardiac: Regular rate and rhythm. No edema Abdomen: Soft and nontender Rectal: In the right perirectal space is about a 1.5 cm wound There is significant surrounding induration and erythema. With pressure there is very foul-smelling reddish-brown drainage.  Not really tender. Neurologic: Alert and fully oriented.Upon process normal.   Assessment/Plan Recent buttock injury and  Now evidence of possibly anaerobic infection and CT scan showing significant gas pocket in the perirectal space.This could be a coincidental perirectal abscess to his injury. He needs I believe this needs to be opened up and debrided and drained in the operating room.  This was discussed with the patient who is in agreement. Discussed the nature of the surgery and risks of anesthesia and bleeding.He will be started on broad-spectrum IV antibiotics.  HEdward Jolly MD 01/12/2016, 7:08 PM

## 2016-01-13 ENCOUNTER — Encounter (HOSPITAL_COMMUNITY): Payer: Self-pay | Admitting: *Deleted

## 2016-01-13 MED ORDER — DOCUSATE SODIUM 100 MG PO CAPS
100.0000 mg | ORAL_CAPSULE | Freq: Two times a day (BID) | ORAL | Status: DC
  Administered 2016-01-13 – 2016-01-14 (×2): 100 mg via ORAL
  Filled 2016-01-13 (×3): qty 1

## 2016-01-13 MED ORDER — OXYCODONE-ACETAMINOPHEN 5-325 MG PO TABS
1.0000 | ORAL_TABLET | ORAL | Status: DC | PRN
Start: 2016-01-13 — End: 2016-01-14
  Administered 2016-01-13 (×2): 1 via ORAL
  Administered 2016-01-14: 2 via ORAL
  Filled 2016-01-13 (×2): qty 2
  Filled 2016-01-13: qty 1

## 2016-01-13 MED ORDER — MORPHINE SULFATE (PF) 2 MG/ML IV SOLN: 2.0000 mg | INTRAVENOUS | Status: DC | PRN

## 2016-01-13 NOTE — Progress Notes (Signed)
Patient ID: Steven Silva, male   DOB: 1961/06/05, 55 y.o.   MRN: [id number] 1 Day Post-Op  Subjective:  Just sore, no other C/O  Vital signs in last 24 hours: Temp:  [98.1 F (36.7 C)-98.7 F (37.1 C)] 98.7 F (37.1 C) (02/26 0603) Pulse Rate:  [74-97] 86 (02/26 0603) Resp:  [12-20] 16 (02/26 0603) BP: (124-155)/(78-97) 126/82 mmHg (02/26 0603) SpO2:  [95 %-100 %] 95 % (02/26 0603) Weight:  [115.214 kg (254 lb)-117.028 kg (258 lb)] 115.214 kg (254 lb) (02/26 0046) Last BM Date: 01/09/16  Intake/Output from previous day: 02/25 0701 - 02/26 0700 In: 450 [I.V.:400; IV Piggyback:50] Out: 455 [Urine:450; Blood:5] Intake/Output this shift:    General appearance: alert, cooperative and no distress Incision/Wound: Moderate bloody drainage on the bandage. 5-10 cm area of erythema affect are unchanged.  Lab Results:   Recent Labs  01/12/16 1333 01/12/16 1616  WBC 12.6* 12.5*  HGB 14.5 13.8  HCT 14.7* 40.2  PLT  --  213   BMET  Recent Labs  01/12/16 1616  NA 137  K 3.8  CL 104  CO2 23  GLUCOSE 94  BUN 16  CREATININE 1.08  CALCIUM 9.0     Studies/Results: Ct Pelvis W Contrast  01/12/2016  CLINICAL DATA:  Buttock abscess. Set on metal thin seen Tuesday. Swelling and drainage. EXAM: CT PELVIS WITH CONTRAST TECHNIQUE: Multidetector CT imaging of the pelvis was performed using the standard protocol following the bolus administration of intravenous contrast. CONTRAST:  15mL OMNIPAQUE IOHEXOL 300 MG/ML  SOLN COMPARISON:  None. FINDINGS: There is gas and soft tissue stranding noted within the right medial buttock subcutaneous soft tissues near the anus and rectum. This is not well-defined but the and flank area measures approximately 7 x 4.6 cm. Visualized large and small bowel are unremarkable. Aorta and iliac vessels are normal caliber. Urinary bladder is unremarkable. No free fluid, free air or adenopathy. No acute bony abnormality. Degenerative changes in the lower  lumbar spine. IMPRESSION: Inflammatory stranding and locules of gas noted within the medial left buttock subcutaneous soft tissues near the anus rectum. No fluid within this area and no well-defined walls. This inflamed area measures up to approximately 7 cm. Electronically Signed   By: Rolm Baptise M.D.   On: 01/12/2016 17:49    Anti-infectives: Anti-infectives    Start     Dose/Rate Route Frequency Ordered Stop   01/12/16 2200  piperacillin-tazobactam (ZOSYN) IVPB 3.375 g     3.375 g 12.5 mL/hr over 240 Minutes Intravenous 3 times per day 01/12/16 1924     01/12/16 1615  vancomycin (VANCOCIN) IVPB 1000 mg/200 mL premix     1,000 mg 200 mL/hr over 60 Minutes Intravenous  Once 01/12/16 1603 01/12/16 1900   01/12/16 1615  ceFAZolin (ANCEF) IVPB 1 g/50 mL premix     1 g 100 mL/hr over 30 Minutes Intravenous  Once 01/12/16 1603 01/12/16 1700      Assessment/Plan: s/p Procedure(s): INCISION AND DRAINAGE ABSCESS buttock abscess  stable postoperatively. Continue IV antibiotics. Plan remove packing tomorrow.    LOS: 1 day    Shirel Mallis T 01/13/2016

## 2016-01-14 ENCOUNTER — Encounter (HOSPITAL_COMMUNITY): Payer: Self-pay | Admitting: General Surgery

## 2016-01-14 LAB — WOUND CULTURE

## 2016-01-14 LAB — CBC
HCT: 38.9 % — ABNORMAL LOW (ref 39.0–52.0)
Hemoglobin: 12.7 g/dL — ABNORMAL LOW (ref 13.0–17.0)
MCH: 30.1 pg (ref 26.0–34.0)
MCHC: 32.6 g/dL (ref 30.0–36.0)
MCV: 92.2 fL (ref 78.0–100.0)
PLATELETS: 223 10*3/uL (ref 150–400)
RBC: 4.22 MIL/uL (ref 4.22–5.81)
RDW: 12.6 % (ref 11.5–15.5)
WBC: 8 10*3/uL (ref 4.0–10.5)

## 2016-01-14 MED ORDER — OXYCODONE-ACETAMINOPHEN 5-325 MG PO TABS: 1.0000 | ORAL_TABLET | ORAL | Status: DC | PRN

## 2016-01-14 MED ORDER — DOCUSATE SODIUM 100 MG PO CAPS: 100.0000 mg | ORAL_CAPSULE | Freq: Two times a day (BID) | ORAL | Status: DC

## 2016-01-14 MED ORDER — AMOXICILLIN-POT CLAVULANATE 875-125 MG PO TABS: 1.0000 | ORAL_TABLET | Freq: Two times a day (BID) | ORAL | Status: DC

## 2016-01-14 NOTE — Care Management Note (Signed)
Case Management Note  Patient Details  Name: Steven Silva MRN: [id number] Date of Birth: [birth year]  Subjective/Objective:      Admitted s/p I&D buttock abscess              Action/Plan: Discharge planning, spoke with patient at bedside. Discussed need for [facility] services for wound care, patient understands and agrees. Patient states wife or mother would be teachable care giver. No preference for [facility] agency, contacted [facility] for referral. Anticipate d/c today.   Expected Discharge Date:  01/14/16               Expected Discharge Plan:  [place]  In-House Referral:  NA  Discharge planning Services  CM Consult  Post Acute Care Choice:  Home Health Choice offered to:  Patient  DME Arranged:  N/A DME Agency:  NA  HH Arranged:  RN [place] Agency:  [place], [place]  Status of Service:  Completed, signed off  Medicare Important Message Given:    Date Medicare IM Given:    Medicare IM give by:    Date Additional Medicare IM Given:    Additional Medicare Important Message give by:     If discussed at [place] of Stay Meetings, dates discussed:    Additional Comments:  Guadalupe Maple, RN 01/14/2016, 10:59 AM

## 2016-01-14 NOTE — Discharge Instructions (Signed)

## 2016-01-14 NOTE — Progress Notes (Signed)
Patient is alert and oriented, discharge instructions reviewed with patient and spouse, home health arranged, questions and concerns answered, prescription given Neta Mends RN 1:22 PM

## 2016-01-14 NOTE — Discharge Summary (Signed)
Physician Discharge Summary  Steven Silva [id number] DOB: 02-Jan-1961 DOA: 01/12/2016  PCP: Crisoforo Oxford, PA-C  Consultation: none  Admit date: 01/12/2016 Discharge date: 01/14/2016  Recommendations for Outpatient Follow-up:   Follow-up Information    Follow up with [facility] On 01/23/2016.   Specialty:  General Surgery   Why:  arrive by 8AM for a 8:30AM post op check   Contact information:   [place] [address] [phone]      Discharge Diagnoses:  1. Perirectal abscess   Surgical Procedure: I&D of perirectal abscess---Dr. [name] 01/12/16  Discharge Condition: stable3 Disposition: home  Diet recommendation: regular  Filed Weights   01/13/16 0046  Weight: 115.214 kg (254 lb)     Filed Vitals:   01/13/16 2130 01/14/16 0628  BP: 121/70 121/85  Pulse: 83 76  Temp: 98.3 F (36.8 C) 98.2 F (36.8 C)  Resp: 18 18     Hospital Course:  Steven Silva is a 55 year old male who sat down on a fence about 4 days prior to ED visit and punctured his buttock.  He presented to [facility] with foul smelling drainage.  CT demonstrated significant gas pocket in the perirectal space.This could be a coincidental perirectal abscess to his injury.  He was admitted and underwent the procedure listed above. He tolerated the procedure well and was transferred to the floor.  On POD#2 packing was removed, no surrounding cellulitis or induration.  Wound was clean without necrotic tissue.  He was tolerating the dressing changes with PO pain meds.  WBC normalized and afebrile.  He was therefore felt stable for discharge home. Medication risks, benefits and therapeutic alternatives were reviewed with the patient.  He verbalizes understanding.  Follow up has been scheduled for a wound check.   Physical Exam: General: NAD.  VSS. Skin: wound is clean, no surrounding erythema or induration. Serosanguinous drainage.  Packing replaced.   Culture  NGD Tdap up to date   Discharge Instructions     Medication List    TAKE these medications        amoxicillin-clavulanate 875-125 MG tablet  Commonly known as:  AUGMENTIN  Take 1 tablet by mouth 2 (two) times daily.     docusate sodium 100 MG capsule  Commonly known as:  COLACE  Take 1 capsule (100 mg total) by mouth 2 (two) times daily.     oxyCODONE-acetaminophen 5-325 MG tablet  Commonly known as:  PERCOCET/ROXICET  Take 1-2 tablets by mouth every 4 (four) hours as needed for moderate pain.           Follow-up Information    Follow up with [facility] On 01/23/2016.   Specialty:  General Surgery   Why:  arrive by 8AM for a 8:30AM post op check   Contact information:   [place] [address] [phone]        The results of significant diagnostics from this hospitalization (including imaging, microbiology, ancillary and laboratory) are listed below for reference.    Significant Diagnostic Studies: Ct Pelvis W Contrast  01/12/2016  CLINICAL DATA:  Buttock abscess. Set on metal thin seen Tuesday. Swelling and drainage. EXAM: CT PELVIS WITH CONTRAST TECHNIQUE: Multidetector CT imaging of the pelvis was performed using the standard protocol following the bolus administration of intravenous contrast. CONTRAST:  152mL OMNIPAQUE IOHEXOL 300 MG/ML  SOLN COMPARISON:  None. FINDINGS: There is gas and soft tissue stranding noted within the  right medial buttock subcutaneous soft tissues near the anus and rectum. This is not well-defined but the and flank area measures approximately 7 x 4.6 cm. Visualized large and small bowel are unremarkable. Aorta and iliac vessels are normal caliber. Urinary bladder is unremarkable. No free fluid, free air or adenopathy. No acute bony abnormality. Degenerative changes in the lower lumbar spine. IMPRESSION: Inflammatory stranding and locules of gas noted within the medial left buttock subcutaneous soft tissues  near the anus rectum. No fluid within this area and no well-defined walls. This inflamed area measures up to approximately 7 cm. Electronically Signed   By: Rolm Baptise M.D.   On: 01/12/2016 17:49    Microbiology: Recent Results (from the past 240 hour(s))  Wound culture     Status: None   Collection Time: 01/12/16  1:26 PM  Result Value Ref Range Status   Gram Stain Few  Final   Gram Stain WBC present-both PMN and Mononuclear  Final   Gram Stain Rare Squamous Epithelial Cells Present  Final   Gram Stain Few GRAM POSITIVE COCCI IN CLUSTERS  Final   Organism ID, Bacteria Multiple Organisms Present,None Predominant  Final    Comment: No Staphylococcus aureus isolated NO GROUP A STREP (S. PYOGENES) ISOLATED   Culture, blood (routine x 2)     Status: None (Preliminary result)   Collection Time: 01/12/16  4:08 PM  Result Value Ref Range Status   Specimen Description RIGHT ANTECUBITAL  Final   Special Requests BOTTLES DRAWN AEROBIC AND ANAEROBIC 5CC  Final   Culture   Final    NO GROWTH < 24 HOURS Performed at [facility]    Report Status PENDING  Incomplete  Culture, blood (routine x 2)     Status: None (Preliminary result)   Collection Time: 01/12/16  4:26 PM  Result Value Ref Range Status   Specimen Description BLOOD LEFT ARM  Final   Special Requests BOTTLES DRAWN AEROBIC AND ANAEROBIC 5CC  Final   Culture   Final    NO GROWTH < 24 HOURS Performed at [facility]    Report Status PENDING  Incomplete     Labs: Basic Metabolic Panel:  Recent Labs Lab 01/12/16 1616  NA 137  K 3.8  CL 104  CO2 23  GLUCOSE 94  BUN 16  CREATININE 1.08  CALCIUM 9.0   Liver Function Tests: No results for input(s): AST, ALT, ALKPHOS, BILITOT, PROT, ALBUMIN in the last 168 hours. No results for input(s): LIPASE, AMYLASE in the last 168 hours. No results for input(s): AMMONIA in the last 168 hours. CBC:  Recent Labs Lab 01/12/16 1333 01/12/16 1616 01/14/16 0431  WBC  12.6* 12.5* 8.0  NEUTROABS  --  9.3*  --   HGB 14.5 13.8 12.7*  HCT 14.7* 40.2 38.9*  MCV 88.1 89.7 92.2  PLT  --  213 223   Cardiac Enzymes: No results for input(s): CKTOTAL, CKMB, CKMBINDEX, TROPONINI in the last 168 hours. BNP: BNP (last 3 results) No results for input(s): BNP in the last 8760 hours.  ProBNP (last 3 results) No results for input(s): PROBNP in the last 8760 hours.  CBG: No results for input(s): GLUCAP in the last 168 hours.  Active Problems:   Abscess of buttock, right   Time coordinating discharge: <30 mins   Signed:  Darrian Goodwill, ANP-BC

## 2016-01-16 LAB — CULTURE, ROUTINE-ABSCESS

## 2016-01-17 LAB — CULTURE, BLOOD (ROUTINE X 2)
CULTURE: NO GROWTH
Culture: NO GROWTH

## 2016-01-24 ENCOUNTER — Encounter (HOSPITAL_BASED_OUTPATIENT_CLINIC_OR_DEPARTMENT_OTHER): Payer: Self-pay | Admitting: *Deleted

## 2016-02-10 ENCOUNTER — Encounter (HOSPITAL_BASED_OUTPATIENT_CLINIC_OR_DEPARTMENT_OTHER): Payer: Self-pay

## 2016-02-24 ENCOUNTER — Ambulatory Visit (HOSPITAL_BASED_OUTPATIENT_CLINIC_OR_DEPARTMENT_OTHER): Payer: BLUE CROSS/BLUE SHIELD | Attending: Family Medicine

## 2016-02-24 VITALS — Ht 72.0 in | Wt 255.0 lb

## 2016-02-24 DIAGNOSIS — G4737 Central sleep apnea in conditions classified elsewhere: Secondary | ICD-10-CM | POA: Diagnosis not present

## 2016-02-24 DIAGNOSIS — R0683 Snoring: Secondary | ICD-10-CM | POA: Diagnosis not present

## 2016-02-24 DIAGNOSIS — G4733 Obstructive sleep apnea (adult) (pediatric): Secondary | ICD-10-CM | POA: Insufficient documentation

## 2016-02-24 DIAGNOSIS — I493 Ventricular premature depolarization: Secondary | ICD-10-CM | POA: Insufficient documentation

## 2016-03-08 DIAGNOSIS — G4733 Obstructive sleep apnea (adult) (pediatric): Secondary | ICD-10-CM | POA: Diagnosis not present

## 2016-03-08 DIAGNOSIS — R0683 Snoring: Secondary | ICD-10-CM | POA: Diagnosis not present

## 2016-03-08 NOTE — Progress Notes (Signed)
   Patient Name: Steven Silva, Steven Silva Date: 02/24/2016 Gender: Male D.O.B: May 31, 1961 Age (years): 54 Referring Provider: Robyn Haber Height (inches): 72 Interpreting Physician: Baird Lyons MD, ABSM Weight (lbs): 255 RPSGT: Joni Reining BMI: 35 MRN: [id number] Neck Size: 17.50 CLINICAL INFORMATION Sleep Study Type: NPSG   Indication for sleep study: obstructive sleep apnea   Epworth Sleepiness Score:7   SLEEP STUDY TECHNIQUE As per the AASM Manual for the Scoring of Sleep and Associated Events v2.3 (April 2016) with a hypopnea requiring 4% desaturations. The channels recorded and monitored were frontal, central and occipital EEG, electrooculogram (EOG), submentalis EMG (chin), nasal and oral airflow, thoracic and abdominal wall motion, anterior tibialis EMG, snore microphone, electrocardiogram, and pulse oximetry.  MEDICATIONS Patient's medications include: charted for review. Medications self-administered by patient during sleep study : No sleep medicine administered.  SLEEP ARCHITECTURE The study was initiated at 10:05:03 PM and ended at 4:34:48 AM. Sleep onset time was 37.9 minutes and the sleep efficiency was 58.6%. The total sleep time was 228.4 minutes. Stage REM latency was N/A minutes. The patient spent 24.08% of the night in stage N1 sleep, 75.92% in stage N2 sleep, 0.00% in stage N3 and 0.00% in REM. Alpha intrusion was absent. Supine sleep was 4.60%. Wake after sleep onset 123 minutes  RESPIRATORY PARAMETERS The overall apnea/hypopnea index (AHI) was 41.5 per hour. There were 140 total apneas, including 52 obstructive, 88 central and 0 mixed apneas. There were 18 hypopneas and 41 RERAs. The AHI during Stage REM sleep was N/A per hour. AHI while supine was 5.7 per hour. The mean oxygen saturation was 94.44%. The minimum SpO2 during sleep was 89.00%. Moderate snoring was noted during this study.  CARDIAC DATA The 2 lead EKG demonstrated sinus rhythm.  The mean heart rate was 61.52 beats per minute. Other EKG findings include: PVCs.  LEG MOVEMENT DATA The total PLMS were 0 with a resulting PLMS index of 0.00. Associated arousal with leg movement index was 0.0 .  IMPRESSIONS - Severe obstructive sleep apnea occurred during this study (AHI = 41.5/h). - Patient was unable to sustain sleep sufficiently to meet protocol requirements for split CPAP titration - Moderate central sleep apnea occurred during this study (CAI = 23.1/h). - The patient had minimal or no oxygen desaturation during the study (Min O2 = 89.00%) - The patient snored with Moderate snoring volume. - EKG findings include PVCs. - Clinically significant periodic limb movements did not occur during sleep. No significant associated arousals.  DIAGNOSIS - Obstructive Sleep Apnea (327.23 [G47.33 ICD-10]) - Central Sleep Apnea (327.27 [G47.37 ICD-10])  RECOMMENDATIONS - CPAP titration to determine optimal pressure required to alleviate sleep disordered breathing. BiPAP or ASV titration may be required to eliminate central sleep apnea. - Avoid alcohol, sedatives and other CNS depressants that may worsen sleep apnea and disrupt normal sleep architecture. - Sleep hygiene should be reviewed to assess factors that may improve sleep quality. - Weight management and regular exercise should be initiated or continued if appropriate.  Deneise Lever Diplomate, American Board of Sleep Medicine  ELECTRONICALLY SIGNED ON:  03/08/2016, 10:37 AM [place] PH: [phone]   FX: [phone] [place]

## 2016-03-18 ENCOUNTER — Telehealth: Payer: Self-pay

## 2016-03-18 NOTE — Telephone Encounter (Signed)
Sleep documents in Procedures tab.

## 2016-03-18 NOTE — Telephone Encounter (Signed)
Pt states he had a sleep study done from Dr Synetta Shadow and would like to know results. Please call [phone]

## 2016-03-18 NOTE — Telephone Encounter (Signed)
-   Severe obstructive sleep apnea occurred during this study.  Patient needs to be on CPAP machine.  Please send the sleep apnea study to [facility] so they can provide a CPAP set up for patient.

## 2016-03-20 ENCOUNTER — Telehealth: Payer: Self-pay

## 2016-03-20 DIAGNOSIS — G4737 Central sleep apnea in conditions classified elsewhere: Secondary | ICD-10-CM

## 2016-03-20 DIAGNOSIS — G4733 Obstructive sleep apnea (adult) (pediatric): Secondary | ICD-10-CM

## 2016-03-20 NOTE — Telephone Encounter (Signed)
Pt is looking for sleep study results he states that is is been about a month now   Best number [phone]

## 2016-03-20 NOTE — Telephone Encounter (Signed)
Advised pt

## 2016-03-21 NOTE — Telephone Encounter (Signed)
Sent order to [facility].

## 2016-03-21 NOTE — Telephone Encounter (Signed)
SPoke with pt, faxed over cpap order to [facility].

## 2016-03-31 NOTE — Telephone Encounter (Addendum)
Pt called and stated that [facility] still needs CPAP settings. When printing off sleep documents I discovered that it looks like pt was never called back for a CPAP titration. I called [facility] and was advised how to put in the order for the correct study to determine proper settings for pt. Put in orders for Dr L. Contacted pt to advise of status and that [facility] should be contacting him for other study. Pt agreed. Dr Carlean Jews, [facility].

## 2016-03-31 NOTE — Telephone Encounter (Signed)
Received fax from [facility] asking for order and settings, but it looks like fax may have come same day we sent the order. I called [facility] and LM asking for CB or new fax w/note if they still need anything further.

## 2016-03-31 NOTE — Addendum Note (Signed)
Addended by: Elwyn Reach A on: 03/31/2016 05:18 PM   Modules accepted: Orders

## 2016-04-09 ENCOUNTER — Ambulatory Visit (HOSPITAL_BASED_OUTPATIENT_CLINIC_OR_DEPARTMENT_OTHER): Payer: BLUE CROSS/BLUE SHIELD | Attending: Family Medicine | Admitting: Internal Medicine

## 2016-04-09 VITALS — Ht 72.0 in | Wt 264.0 lb

## 2016-04-09 DIAGNOSIS — I493 Ventricular premature depolarization: Secondary | ICD-10-CM | POA: Insufficient documentation

## 2016-04-09 DIAGNOSIS — G4730 Sleep apnea, unspecified: Secondary | ICD-10-CM | POA: Diagnosis present

## 2016-04-09 DIAGNOSIS — R0683 Snoring: Secondary | ICD-10-CM | POA: Insufficient documentation

## 2016-04-09 DIAGNOSIS — G4733 Obstructive sleep apnea (adult) (pediatric): Secondary | ICD-10-CM | POA: Insufficient documentation

## 2016-04-14 DIAGNOSIS — G4733 Obstructive sleep apnea (adult) (pediatric): Secondary | ICD-10-CM

## 2016-04-14 NOTE — Procedures (Signed)
  Patient Name: Steven Silva, Brogden Date: 04/09/2016 Gender: Male D.O.B: 1961-04-28 Age (years): 54 Referring Provider: Robyn Haber Height (inches): 72 Interpreting Physician: Baird Lyons MD, ABSM Weight (lbs): 264 RPSGT: Carolin Coy BMI: 36 MRN: [id number] Neck Size: 17.50 CLINICAL INFORMATION The patient is referred for a CPAP titration to treat sleep apnea.   Date of NPSG, Split Night or HST: Diagnostic NPSG 02/24/16- AHI 41.5/ hr, desaturation to 89%, body weight 255 lbs  SLEEP STUDY TECHNIQUE As per the AASM Manual for the Scoring of Sleep and Associated Events v2.3 (April 2016) with a hypopnea requiring 4% desaturations. The channels recorded and monitored were frontal, central and occipital EEG, electrooculogram (EOG), submentalis EMG (chin), nasal and oral airflow, thoracic and abdominal wall motion, anterior tibialis EMG, snore microphone, electrocardiogram, and pulse oximetry. Continuous positive airway pressure (CPAP) was initiated at the beginning of the study and titrated to treat sleep-disordered breathing.  MEDICATIONS Medications taken by the patient : charted for review Medications administered by patient during sleep study : No sleep medicine administered.  TECHNICIAN COMMENTS Comments added by technician: Patient was ordered as a cpap titration.  Comments added by scorer: N/A  RESPIRATORY PARAMETERS Optimal PAP Pressure (cm): 13 AHI at Optimal Pressure (/hr): 0.0 Overall Minimal O2 (%): 91.00 Supine % at Optimal Pressure (%): 0 Minimal O2 at Optimal Pressure (%): 94.0    SLEEP ARCHITECTURE The study was initiated at 10:52:14 PM and ended at 5:27:35 AM. Sleep onset time was 11.1 minutes and the sleep efficiency was 87.9%. The total sleep time was 347.5 minutes. The patient spent 8.20% of the night in stage N1 sleep, 71.94% in stage N2 sleep, 0.00% in stage N3 and 19.86% in REM.Stage REM latency was 15.0 minutes Wake after sleep onset was 36.8.  Alpha intrusion was absent. Supine sleep was 0.00%.  CARDIAC DATA The 2 lead EKG demonstrated sinus rhythm. The mean heart rate was 61.70 beats per minute. Other EKG findings include: PVCs  LEG MOVEMENT DATA The total Periodic Limb Movements of Sleep (PLMS) were 183. The PLMS index was 31.60. A PLMS index of <15 is considered normal in adults.  IMPRESSIONS - The optimal PAP pressure was 13 cm of water. - Central sleep apnea was not noted during this titration (CAI = 1.0/h). - Significant oxygen desaturations were not observed during this titration (min O2 = 91.00%). - The patient snored with Moderate snoring volume during this titration study. - No cardiac abnormalities were observed during this study.  DIAGNOSIS - Obstructive Sleep Apnea (327.23 [G47.33 ICD-10])  RECOMMENDATIONS - Trial of CPAP therapy on 13 cm H2O with a Medium size Resmed Full Face Mask AirFit F20 mask and heated humidification. - Avoid alcohol, sedatives and other CNS depressants that may worsen sleep apnea and disrupt normal sleep architecture. - Sleep hygiene should be reviewed to assess factors that may improve sleep quality. - Weight management and regular exercise should be initiated or continued.  Deneise Lever Diplomate, American Board of Sleep Medicine  ELECTRONICALLY SIGNED ON:  04/14/2016, 3:24 PM [place] PH: [phone]   FX: [phone] [place]

## 2016-04-21 ENCOUNTER — Telehealth: Payer: Self-pay

## 2016-04-21 DIAGNOSIS — G4733 Obstructive sleep apnea (adult) (pediatric): Secondary | ICD-10-CM

## 2016-04-21 NOTE — Telephone Encounter (Signed)
Patient needs the sleep study faxed to [facility]. He wasn't sure if it's been done or not but he's calling to check. Patient does not have fax number Patient phone: [phone]

## 2016-04-23 NOTE — Telephone Encounter (Signed)
This was done yesterday by me. Faxed to [phone]

## 2016-04-24 ENCOUNTER — Telehealth: Payer: Self-pay

## 2016-04-24 NOTE — Telephone Encounter (Signed)
[facility] faxed a notice that they need a new script with the pressure setting on the Rx. They can not just take the info from the titration study. I will take the study to a PA to comment on correct setting and then print order in Dr Lauenstein's name, verbal with readback.

## 2016-04-24 NOTE — Telephone Encounter (Signed)
Faxed order and another copy of titration study with confirmation. LMOM for pt with status update. Asked him to call back if anything else is needed.

## 2016-04-24 NOTE — Addendum Note (Signed)
Addended by: Jaynee Eagles on: 04/24/2016 02:22 PM   Modules accepted: Orders

## 2016-04-24 NOTE — Telephone Encounter (Addendum)
I placed the order but please let me know if you need me to do anything else. The order printed and I placed it in the nurses' box.

## 2016-04-24 NOTE — Telephone Encounter (Signed)
Please reference call from 04/21/16. Pt says [facility] needs the pressure the C-pap should  be set at. Fax #  [phone]. Please advise pt at [phone].

## 2016-04-24 NOTE — Telephone Encounter (Signed)
Do we determine this?

## 2016-04-25 NOTE — Telephone Encounter (Signed)
I faxed the report.

## 2016-04-25 NOTE — Telephone Encounter (Signed)
No, we don't determine that, but it's on the CPAP titration report.  It's normally sent to the company on the order for CPAP. Since we have that, we can give them the pressure for him, which is 13 cm.  This is from Dr. Janee Morn note 5/24: RECOMMENDATIONS - Trial of CPAP therapy on 13 cm H2O with a Medium size Resmed Full Face Mask AirFit F20 mask and heated humidification. - Avoid alcohol, sedatives and other CNS depressants that may worsen sleep apnea and disrupt normal sleep architecture. - Sleep hygiene should be reviewed to assess factors that may improve sleep quality. - Weight management and regular exercise should be initiated or continued.

## 2017-02-07 IMAGING — CT CT PELVIS W/ CM
2 of 3 series · 17 of 46 positions shown, 19 images · IV contrast (omnipaque)
Comparison: None.

CLINICAL DATA: Buttock abscess. Set on metal thin seen [REDACTED].
Swelling and drainage.

EXAM:
CT PELVIS WITH CONTRAST
TECHNIQUE: Multidetector CT imaging of the pelvis was performed using the
standard protocol following the bolus administration of intravenous
contrast.
CONTRAST:  100mL OMNIPAQUE IOHEXOL 300 MG/ML  SOLN

[Series 2: pelvis with · axial · 0.78mm/px · z∈[+928,+1238]mm · 14 of 72 slices shown, 16 images]
[im 5/72  soft-tissue]
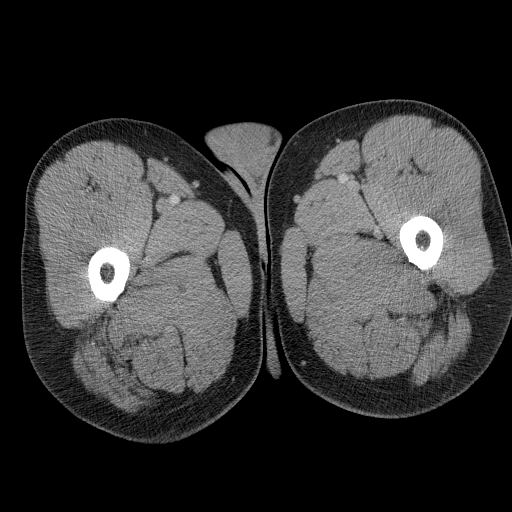
[im 5/72  bone]
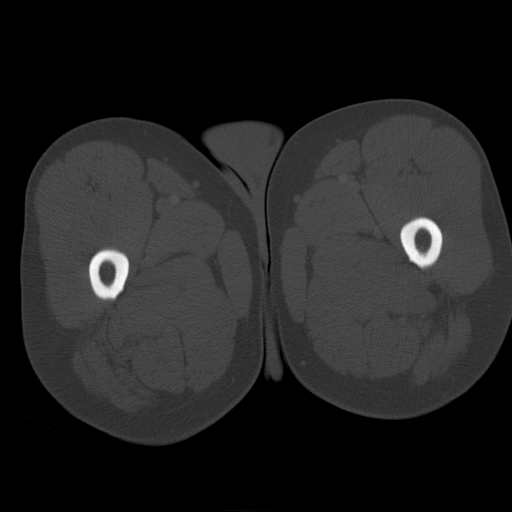
[im 10/72  soft-tissue]
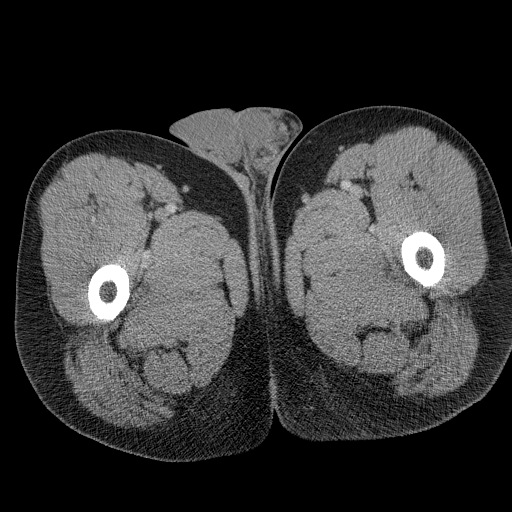
[im 14/72  soft-tissue]
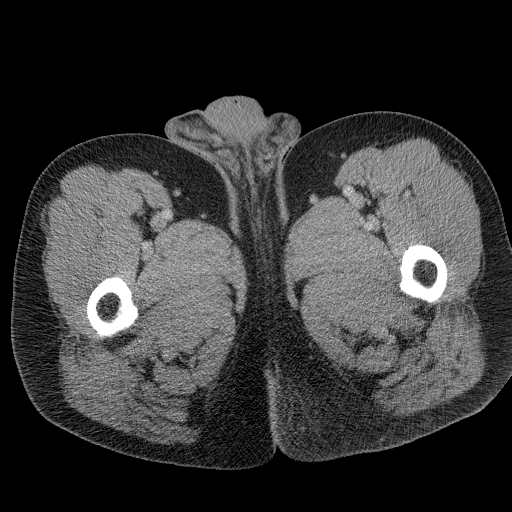
[im 19/72  soft-tissue]
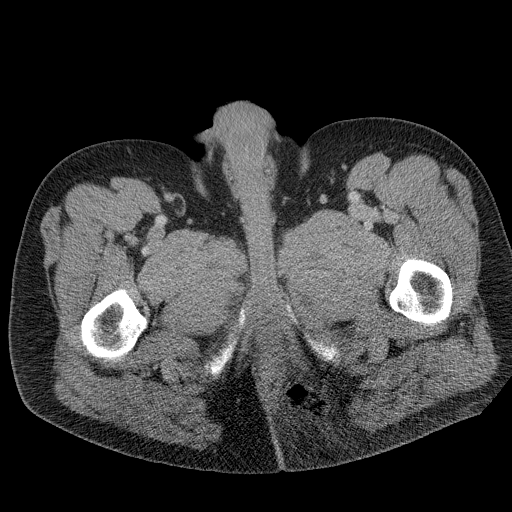
[im 23/72  soft-tissue]
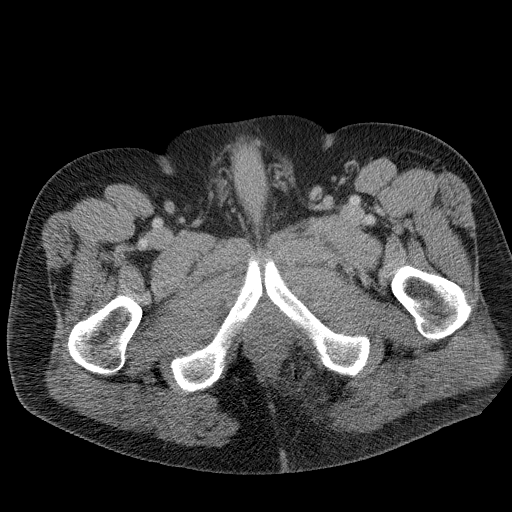
[im 28/72  soft-tissue]
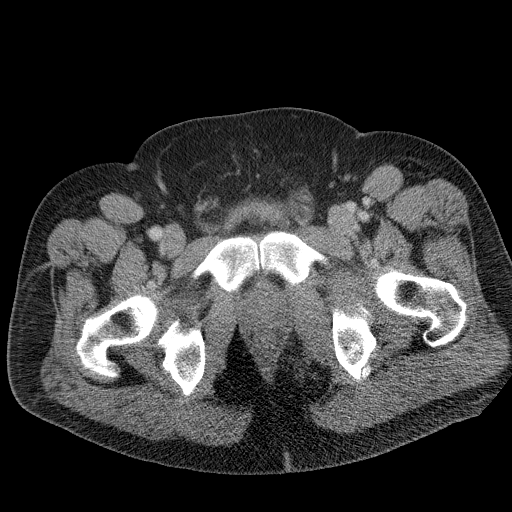
[im 33/72  soft-tissue]
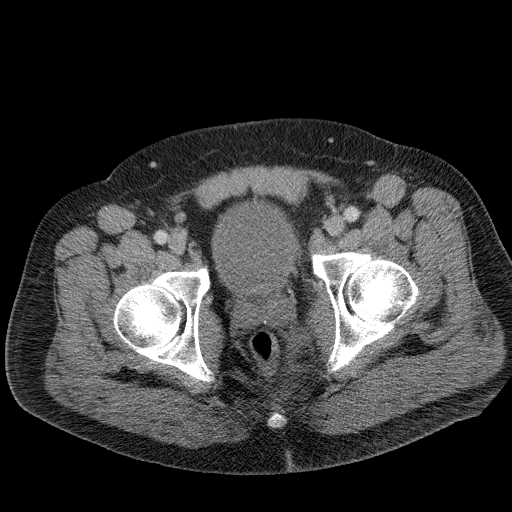
[im 39/72  soft-tissue]
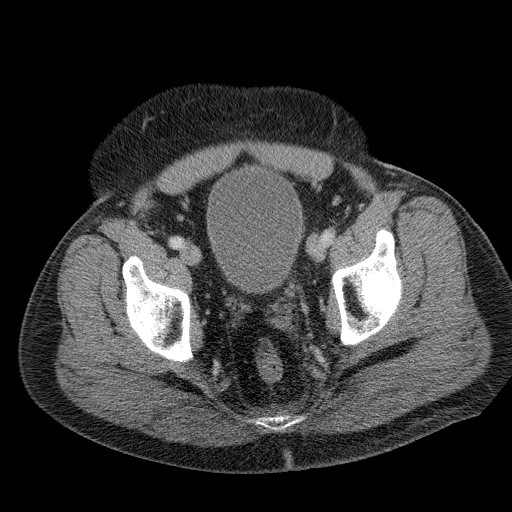
[im 44/72  soft-tissue]
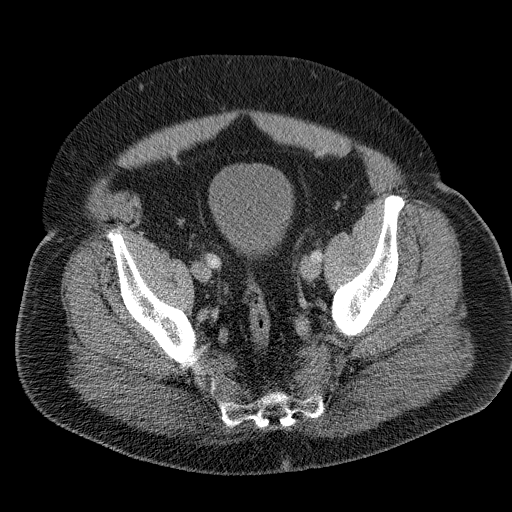
[im 44/72  bone]
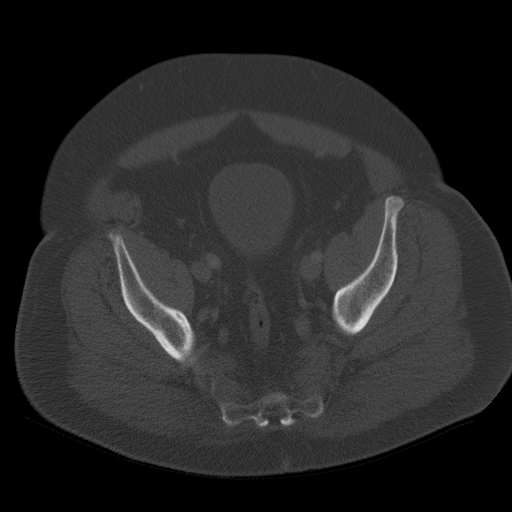
[im 49/72  soft-tissue]
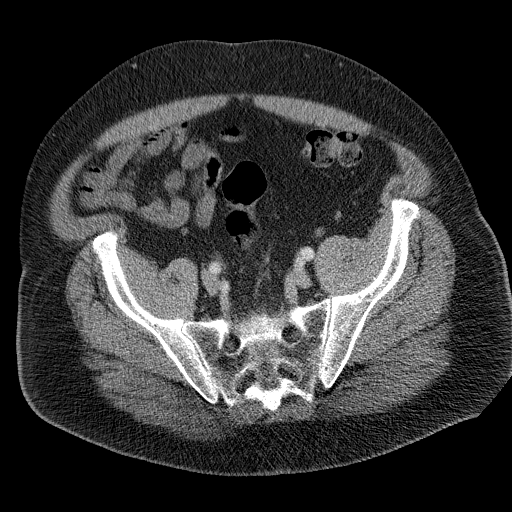
[im 53/72  soft-tissue]
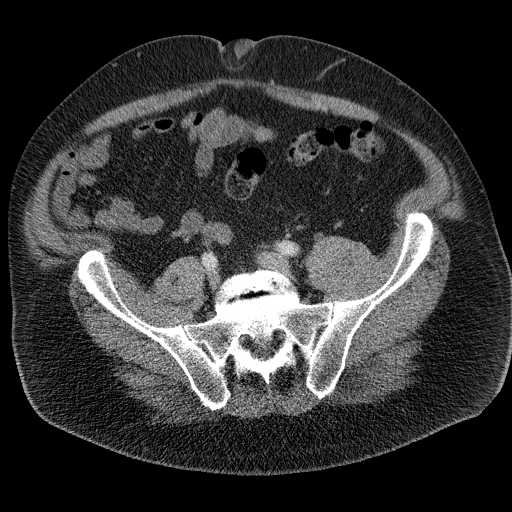
[im 58/72  soft-tissue]
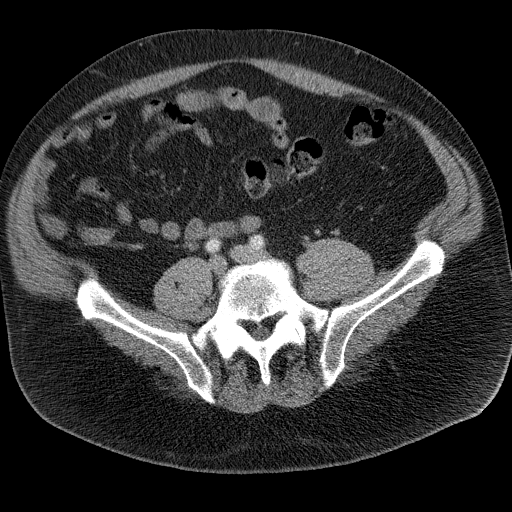
[im 62/72  soft-tissue]
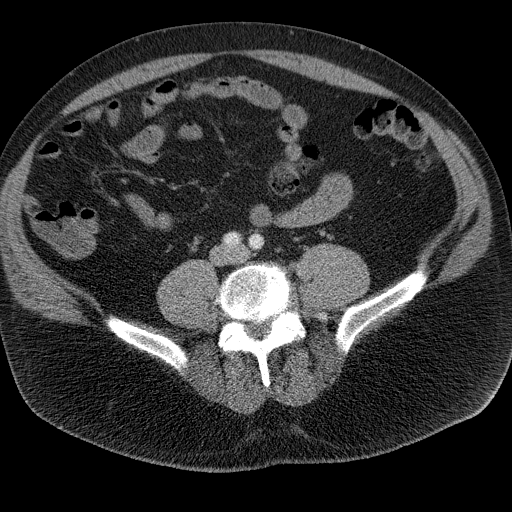
[im 67/72  soft-tissue]
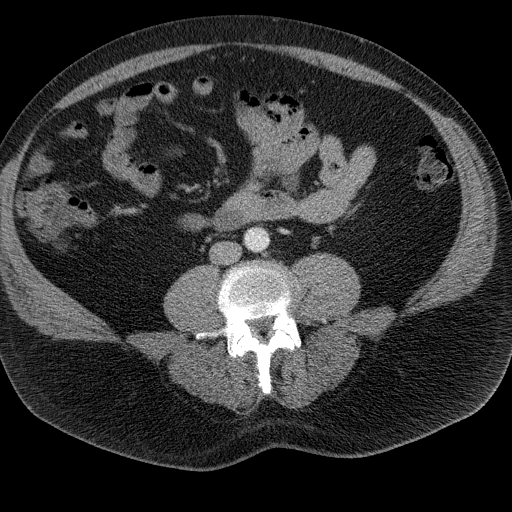

[Series 3: coronal images · coronal · 0.70mm/px · 3 of 124 slices shown]
[im 42/124  soft-tissue]
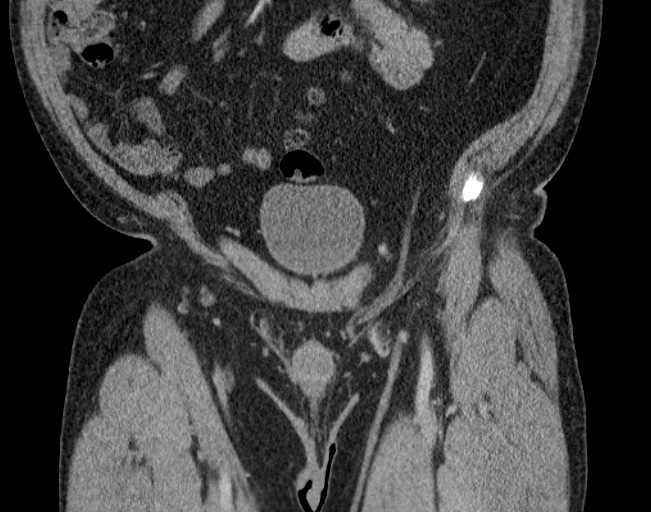
[im 55/124  soft-tissue]
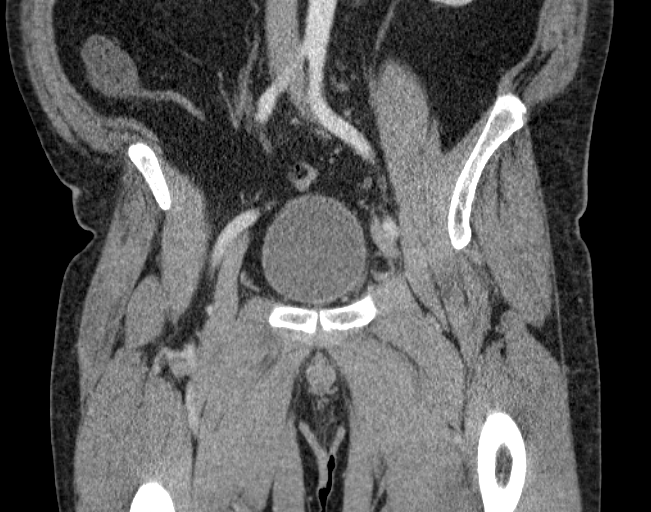
[im 69/124  soft-tissue]
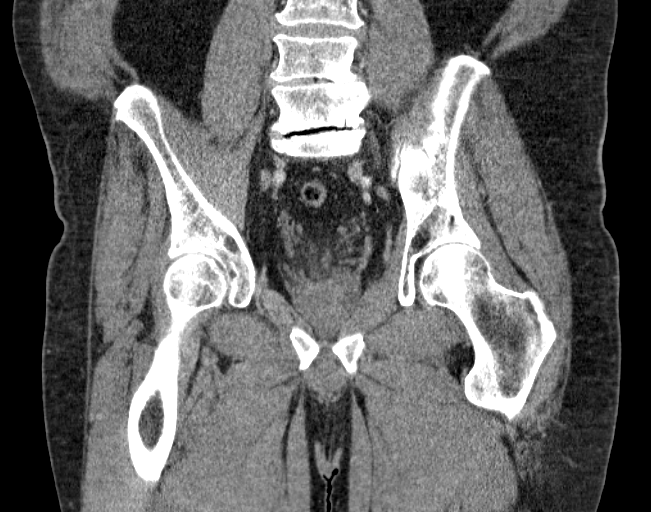

[17 of 46 positions shown; findings below may reference images not displayed]

FINDINGS: There is gas and soft tissue stranding noted within the right medial
buttock subcutaneous soft tissues near the anus and rectum. This is
not well-defined but the and flank area measures approximately 7 x
4.6 cm. Visualized large and small bowel are unremarkable. Aorta and
iliac vessels are normal caliber. Urinary bladder is unremarkable.
No free fluid, free air or adenopathy. No acute bony abnormality.
Degenerative changes in the lower lumbar spine.
IMPRESSION: Inflammatory stranding and locules of gas noted within the medial
left buttock subcutaneous soft tissues near the anus rectum. No
fluid within this area and no well-defined walls. This inflamed area
measures up to approximately 7 cm.

## 2017-11-26 DIAGNOSIS — M7651 Patellar tendinitis, right knee: Secondary | ICD-10-CM | POA: Diagnosis not present

## 2017-11-26 DIAGNOSIS — M25561 Pain in right knee: Secondary | ICD-10-CM | POA: Diagnosis not present

## 2018-08-20 ENCOUNTER — Other Ambulatory Visit: Payer: Self-pay

## 2018-08-20 ENCOUNTER — Encounter: Payer: Self-pay | Admitting: Family Medicine

## 2018-08-20 ENCOUNTER — Ambulatory Visit: Payer: Self-pay | Admitting: Family Medicine

## 2018-08-20 VITALS — BP 132/88 | HR 65 | Temp 98.0°F | Ht 72.5 in | Wt 265.4 lb

## 2018-08-20 DIAGNOSIS — Z1211 Encounter for screening for malignant neoplasm of colon: Secondary | ICD-10-CM

## 2018-08-20 DIAGNOSIS — Z9989 Dependence on other enabling machines and devices: Secondary | ICD-10-CM

## 2018-08-20 DIAGNOSIS — G4733 Obstructive sleep apnea (adult) (pediatric): Secondary | ICD-10-CM

## 2018-08-20 DIAGNOSIS — Z0000 Encounter for general adult medical examination without abnormal findings: Secondary | ICD-10-CM

## 2018-08-20 NOTE — Patient Instructions (Addendum)
Follow up with me the first of the year to discuss blood work.   I placed a referral for colonoscopy, but let me know if that needs to be resent after the first of the year.  I recommend flu vaccine as soon as possible.   You can discuss shingles vaccine with your pharmacist.   Exercise 150 minutes per week  call and schedule optho visit.   Thanks for coming in today .   Keeping you healthy  Get these tests  Blood pressure- Have your blood pressure checked once a year by your healthcare provider.  Normal blood pressure is 120/80  Weight- Have your body mass index (BMI) calculated to screen for obesity.  BMI is a measure of body fat based on height and weight. You can also calculate your own BMI at [email or web address].  Cholesterol- Have your cholesterol checked every year.  Diabetes- Have your blood sugar checked regularly if you have high blood pressure, high cholesterol, have a family history of diabetes or if you are overweight.  Screening for Colon Cancer- Colonoscopy starting at age 20.  Screening may begin sooner depending on your family history and other health conditions. Follow up colonoscopy as directed by your Gastroenterologist.  Screening for Prostate Cancer- Both blood work (PSA) and a rectal exam help screen for Prostate Cancer.  Screening begins at age 73 with African-American men and at age 35 with Caucasian men.  Screening may begin sooner depending on your family history.  Take these medicines  Aspirin- One aspirin daily can help prevent Heart disease and Stroke.  Flu shot- Every fall.  Tetanus- Every 10 years.  Zostavax- Once after the age of 31 to prevent Shingles.  Pneumonia shot- Once after the age of 42; if you are younger than 44, ask your healthcare provider if you need a Pneumonia shot.  Take these steps  Don't smoke- If you do smoke, talk to your doctor about quitting.  For tips on how to quit, go to www.smokefree.gov or call  1-800-QUIT-NOW.  Be physically active- Exercise 5 days a week for at least 30 minutes.  If you are not already physically active start slow and gradually work up to 30 minutes of moderate physical activity.  Examples of moderate activity include walking briskly, mowing the yard, dancing, swimming, bicycling, etc.  Eat a healthy diet- Eat a variety of healthy food such as fruits, vegetables, low fat milk, low fat cheese, yogurt, lean meant, poultry, fish, beans, tofu, etc. For more information go to www.thenutritionsource.org  Drink alcohol in moderation- Limit alcohol intake to less than two drinks a day. Never drink and drive.  Dentist- Brush and floss twice daily; visit your dentist twice a year.  Depression- Your emotional health is as important as your physical health. If you're feeling down, or losing interest in things you would normally enjoy please talk to your healthcare provider.  Eye exam- Visit your eye doctor every year.  Safe sex- If you may be exposed to a sexually transmitted infection, use a condom.  Seat belts- Seat belts can save your life; always wear one.  Smoke/Carbon Monoxide detectors- These detectors need to be installed on the appropriate level of your home.  Replace batteries at least once a year.  Skin cancer- When out in the sun, cover up and use sunscreen 15 SPF or higher.  Violence- If anyone is threatening you, please tell your healthcare provider.  Living Will/ Health care power of attorney- Speak with your healthcare provider and family.  If you have lab work done today you will be contacted with your lab results within the next 2 weeks.  If you have not heard from [place] then please contact us. The fastest way to get your results is to register for My Chart.   IF you received an x-ray today, you will receive an invoice from [facility]. Please contact [facility] at [phone] with questions or concerns regarding your invoice.   IF you  received labwork today, you will receive an invoice from [facility]. Please contact [facility] at [phone] with questions or concerns regarding your invoice.   Our billing staff will not be able to assist you with questions regarding bills from these companies.  You will be contacted with the lab results as soon as they are available. The fastest way to get your results is to activate your My Chart account. Instructions are located on the last page of this paperwork. If you have not heard from [place] regarding the results in 2 weeks, please contact this office.

## 2018-08-20 NOTE — Progress Notes (Signed)
Subjective:  By signing my name below, I, Steven Silva, attest that this documentation has been prepared under the direction and in the presence of Steven Agreste, MD Electronically Signed: Ladene Artist, ED Scribe 08/20/2018 at 1:55 PM.   Patient ID: Steven Silva, male    DOB: 11-28-1960, 57 y.o.   MRN: [id number]  Chief Complaint  Patient presents with  . Annual Exam   HPI Steven Silva is a 57 y.o. male who presents to Primary Care at [facility] for an annual exam. H/o OSA, sleep study in 02/2016 AHI 41.5. Seen by Dr. Annamaria Boots 03/2016 for CPAP titration. Weight was 264 at that time.  Elevated BP No h/o HTN or BP meds.  CA Screening Colonoscopy: Pt has never had a colonoscopy; he always does in office or OTC fecal testing which he reportedly have always been neg. He doesn't mind either the colonoscopy or cologuard but wants to wait until Jan when he has insurance. Prostate CA Screening: last PSA by urologist Dr. Risa Silva 3 yrs ago, normal. Pt plans to wait until Jan for lab work when he has coverage. Denies difficulty urinating, new symptoms. No results found for: PSA  Immunizations Immunization History  Administered Date(s) Administered  . PPD Test 05/11/2013  Tdap: pt thinks his tdap was updated in 12/2015 Flu: declines Shingrix: deferred at this time  Depression Screening Depression screen [facility] 2/9 08/20/2018 01/12/2016 12/02/2015  Decreased Interest 0 0 0  Down, Depressed, Hopeless 0 0 0  PHQ - 2 Score 0 0 0     Visual Acuity Screening   Right eye Left eye Both eyes  Without correction: 20/20 20/20 20/15   With correction:      Vision: wears glasses; last visit 3 yrs ago Dentist: regularly Exercise: some but not much  Patient Active Problem List   Diagnosis Date Noted  . Abscess of buttock, right 01/12/2016   History reviewed. No pertinent past medical history. Past Surgical History:  Procedure Laterality Date  . INCISION AND DRAINAGE ABSCESS Left 01/12/2016   Procedure: INCISION AND DRAINAGE ABSCESS buttock abscess;  Surgeon: [name], MD;  Location: [facility] ORS;  Service: General;  Laterality: Left;  . SPINE SURGERY     No Known Allergies Prior to Admission medications   Medication Sig Start Date End Date Taking? Authorizing Provider  amoxicillin-clavulanate (AUGMENTIN) 875-125 MG tablet Take 1 tablet by mouth 2 (two) times daily. 01/14/16   [name], [name], NP  docusate sodium (COLACE) 100 MG capsule Take 1 capsule (100 mg total) by mouth 2 (two) times daily. 01/14/16   [name], [name], NP  oxyCODONE-acetaminophen (PERCOCET/ROXICET) 5-325 MG tablet Take 1-2 tablets by mouth every 4 (four) hours as needed for moderate pain. 01/14/16   [name], NP   Social History   Socioeconomic History  . Marital status: Married    Spouse name: Not on file  . Number of children: Not on file  . Years of education: Not on file  . Highest education level: Not on file  Occupational History  . Not on file  Social Needs  . Financial resource strain: Not on file  . Food insecurity:    Worry: Not on file    Inability: Not on file  . Transportation needs:    Medical: Not on file    Non-medical: Not on file  Tobacco Use  . Smoking status: Never Smoker  . Smokeless tobacco: Never Used  Substance and Sexual Activity  . Alcohol use: No    Alcohol/week:  0.0 standard drinks  . Drug use: No  . Sexual activity: Yes  Lifestyle  . Physical activity:    Days per week: Not on file    Minutes per session: Not on file  . Stress: Not on file  Relationships  . Social connections:    Talks on phone: Not on file    Gets together: Not on file    Attends religious service: Not on file    Active member of club or organization: Not on file    Attends meetings of clubs or organizations: Not on file    Relationship status: Not on file  . Intimate partner violence:    Fear of current or ex partner: Not on file    Emotionally abused: Not on file    Physically  abused: Not on file    Forced sexual activity: Not on file  Other Topics Concern  . Not on file  Social History Narrative  . Not on file   Review of Systems  Genitourinary: Negative for difficulty urinating.      Objective:   Physical Exam  Constitutional: He is oriented to person, place, and time. He appears well-developed and well-nourished.  HENT:  Head: Normocephalic and atraumatic.  Right Ear: External ear normal.  Left Ear: External ear normal.  Mouth/Throat: Oropharynx is clear and moist.  Eyes: Pupils are equal, round, and reactive to light. Conjunctivae and EOM are normal.  Neck: Normal range of motion. Neck supple. No thyromegaly present.  Cardiovascular: Normal rate, regular rhythm, normal heart sounds and intact distal pulses.  Pulmonary/Chest: Effort normal and breath sounds normal. No respiratory distress. He has no wheezes.  Abdominal: Soft. He exhibits no distension. There is no tenderness.  Musculoskeletal: Normal range of motion. He exhibits no edema or tenderness.  Lymphadenopathy:    He has no cervical adenopathy.  Neurological: He is alert and oriented to person, place, and time. He has normal reflexes.  Skin: Skin is warm and dry.  Psychiatric: He has a normal mood and affect. His behavior is normal.  Vitals reviewed.    Vitals:   08/20/18 1338 08/20/18 1434  BP: (!) 159/90 132/88  Pulse: 65   Temp: 98 F (36.7 C)   TempSrc: Oral   SpO2: 95%   Weight: 265 lb 6.4 oz (120.4 kg)   Height: 6' 0.5" (1.842 m)       Assessment & Plan:  Steven Silva is a 57 y.o. male Annual physical exam  - -anticipatory guidance as below in AVS, screening labs planned after first of the year.  Health maintenance items as above in HPI discussed/recommended as applicable. Referral to GI with planned colonoscopy after Jan 1st.   OSA on CPAP  -  Stable on treatment.   Special screening for malignant neoplasms, colon - Plan: Ambulatory referral to  Gastroenterology  - as above.   No orders of the defined types were placed in this encounter.  Patient Instructions   Follow up with me the first of the year to discuss blood work.   I placed a referral for colonoscopy, but let me know if that needs to be resent after the first of the year.  I recommend flu vaccine as soon as possible.   You can discuss shingles vaccine with your pharmacist.   Exercise 150 minutes per week  call and schedule optho visit.   Thanks for coming in today .   Keeping you healthy  Get these tests  Blood pressure- Have  your blood pressure checked once a year by your healthcare provider.  Normal blood pressure is 120/80  Weight- Have your body mass index (BMI) calculated to screen for obesity.  BMI is a measure of body fat based on height and weight. You can also calculate your own BMI at ViewBanking.[email or web address].  Cholesterol- Have your cholesterol checked every year.  Diabetes- Have your blood sugar checked regularly if you have high blood pressure, high cholesterol, have a family history of diabetes or if you are overweight.  Screening for Colon Cancer- Colonoscopy starting at age 68.  Screening may begin sooner depending on your family history and other health conditions. Follow up colonoscopy as directed by your Gastroenterologist.  Screening for Prostate Cancer- Both blood work (PSA) and a rectal exam help screen for Prostate Cancer.  Screening begins at age 24 with African-American men and at age 46 with Caucasian men.  Screening may begin sooner depending on your family history.  Take these medicines  Aspirin- One aspirin daily can help prevent Heart disease and Stroke.  Flu shot- Every fall.  Tetanus- Every 10 years.  Zostavax- Once after the age of 38 to prevent Shingles.  Pneumonia shot- Once after the age of 62; if you are younger than 43, ask your healthcare provider if you need a Pneumonia shot.  Take these steps  Don't smoke-  If you do smoke, talk to your doctor about quitting.  For tips on how to quit, go to www.smokefree.gov or call 1-800-QUIT-NOW.  Be physically active- Exercise 5 days a week for at least 30 minutes.  If you are not already physically active start slow and gradually work up to 30 minutes of moderate physical activity.  Examples of moderate activity include walking briskly, mowing the yard, dancing, swimming, bicycling, etc.  Eat a healthy diet- Eat a variety of healthy food such as fruits, vegetables, low fat milk, low fat cheese, yogurt, lean meant, poultry, fish, beans, tofu, etc. For more information go to www.thenutritionsource.org  Drink alcohol in moderation- Limit alcohol intake to less than two drinks a day. Never drink and drive.  Dentist- Brush and floss twice daily; visit your dentist twice a year.  Depression- Your emotional health is as important as your physical health. If you're feeling down, or losing interest in things you would normally enjoy please talk to your healthcare provider.  Eye exam- Visit your eye doctor every year.  Safe sex- If you may be exposed to a sexually transmitted infection, use a condom.  Seat belts- Seat belts can save your life; always wear one.  Smoke/Carbon Monoxide detectors- These detectors need to be installed on the appropriate level of your home.  Replace batteries at least once a year.  Skin cancer- When out in the sun, cover up and use sunscreen 15 SPF or higher.  Violence- If anyone is threatening you, please tell your healthcare provider.  Living Will/ Health care power of attorney- Speak with your healthcare provider and family.  If you have lab work done today you will be contacted with your lab results within the next 2 weeks.  If you have not heard from [place] then please contact us. The fastest way to get your results is to register for My Chart.   IF you received an x-ray today, you will receive an invoice from [facility].  Please contact [facility] at [phone] with questions or concerns regarding your invoice.   IF you received labwork today, you will receive an invoice from [facility].  Please contact [facility] at [phone] with questions or concerns regarding your invoice.   Our billing staff will not be able to assist you with questions regarding bills from these companies.  You will be contacted with the lab results as soon as they are available. The fastest way to get your results is to activate your My Chart account. Instructions are located on the last page of this paperwork. If you have not heard from [place] regarding the results in 2 weeks, please contact this office.      I personally performed the services described in this documentation, which was scribed in my presence. The recorded information has been reviewed and considered for accuracy and completeness, addended by me as needed, and agree with information above.  Signed,   Merri Ray, MD [facility].  08/22/18 9:17 PM

## 2018-09-23 ENCOUNTER — Encounter: Payer: Self-pay | Admitting: Family Medicine

## 2018-10-18 ENCOUNTER — Encounter: Payer: Self-pay | Admitting: Internal Medicine

## 2018-11-15 ENCOUNTER — Ambulatory Visit (AMBULATORY_SURGERY_CENTER): Payer: Self-pay | Admitting: *Deleted

## 2018-11-15 VITALS — Ht 73.0 in | Wt 273.0 lb

## 2018-11-15 DIAGNOSIS — Z1211 Encounter for screening for malignant neoplasm of colon: Secondary | ICD-10-CM

## 2018-11-15 MED ORDER — NA SULFATE-K SULFATE-MG SULF 17.5-3.13-1.6 GM/177ML PO SOLN: 1.0000 | Freq: Once | ORAL | 0 refills | Status: AC

## 2018-11-15 NOTE — Progress Notes (Signed)
No egg or soy allergy known to patient  No issues with past sedation with any surgeries  or procedures, no intubation problems  No diet pills per patient No home 02 use per patient  No blood thinners per patient  Pt denies issues with constipation  No A fib or A flutter  EMMI video sent to pt's e mail  Suprep $15 to pt in PV

## 2018-11-22 ENCOUNTER — Ambulatory Visit: Payer: Self-pay | Admitting: Family Medicine

## 2018-11-29 ENCOUNTER — Encounter: Payer: Self-pay | Admitting: Internal Medicine

## 2018-12-06 ENCOUNTER — Ambulatory Visit (AMBULATORY_SURGERY_CENTER): Payer: BLUE CROSS/BLUE SHIELD | Admitting: Internal Medicine

## 2018-12-06 ENCOUNTER — Encounter: Payer: Self-pay | Admitting: Internal Medicine

## 2018-12-06 VITALS — BP 137/80 | HR 67 | Temp 98.0°F | Resp 12 | Ht 73.0 in | Wt 273.0 lb

## 2018-12-06 DIAGNOSIS — D120 Benign neoplasm of cecum: Secondary | ICD-10-CM | POA: Diagnosis not present

## 2018-12-06 DIAGNOSIS — Z1211 Encounter for screening for malignant neoplasm of colon: Secondary | ICD-10-CM | POA: Diagnosis not present

## 2018-12-06 MED ORDER — SODIUM CHLORIDE 0.9 % IV SOLN: 500.0000 mL | Freq: Once | INTRAVENOUS | Status: DC

## 2018-12-06 NOTE — Progress Notes (Signed)
A/ox3, pleased with MAC, report to RN 

## 2018-12-06 NOTE — Op Note (Signed)
[facility] Patient Name: Steven Silva Procedure Date: 12/06/2018 3:13 PM MRN: [id number] Endoscopist: Jerene Bears , MD Age: 58 Referring MD:  Date of Birth: 04-Oct-1961 Gender: Male Account #: [id number] Procedure:                Colonoscopy Indications:              Screening for colorectal malignant neoplasm, This                            is the patient's first colonoscopy Medicines:                Monitored Anesthesia Care Procedure:                Pre-Anesthesia Assessment:                           - Prior to the procedure, a History and Physical                            was performed, and patient medications and                            allergies were reviewed. The patient's tolerance of                            previous anesthesia was also reviewed. The risks                            and benefits of the procedure and the sedation                            options and risks were discussed with the patient.                            All questions were answered, and informed consent                            was obtained. Prior Anticoagulants: The patient has                            taken no previous anticoagulant or antiplatelet                            agents. ASA Grade Assessment: II - A patient with                            mild systemic disease. After reviewing the risks                            and benefits, the patient was deemed in                            satisfactory condition to undergo the procedure.  After obtaining informed consent, the colonoscope                            was passed under direct vision. Throughout the                            procedure, the patient's blood pressure, pulse, and                            oxygen saturations were monitored continuously. The                            Colonoscope was introduced through the anus and                            advanced to the cecum,  identified by appendiceal                            orifice and ileocecal valve. The colonoscopy was                            performed without difficulty. The patient tolerated                            the procedure well. The quality of the bowel                            preparation was good. The ileocecal valve,                            appendiceal orifice, and rectum were photographed. Scope In: 3:22:00 PM Scope Out: 3:38:19 PM Scope Withdrawal Time: 0 hours 14 minutes 46 seconds  Total Procedure Duration: 0 hours 16 minutes 19 seconds  Findings:                 The digital rectal exam was normal.                           A 2 mm polyp was found in the ileocecal valve. The                            polyp was sessile. The polyp was removed with a                            cold biopsy forceps. Resection and retrieval were                            complete.                           Multiple small and large-mouthed diverticula were                            found in the sigmoid colon.  The retroflexed view of the distal rectum and anal                            verge was normal and showed no anal or rectal                            abnormalities. Complications:            No immediate complications. Estimated Blood Loss:     Estimated blood loss was minimal. Impression:               - One 2 mm polyp at the ileocecal valve, removed                            with a cold biopsy forceps. Resected and retrieved.                           - Diverticulosis in the sigmoid colon.                           - The distal rectum and anal verge are normal on                            retroflexion view. Recommendation:           - Patient has a contact number available for                            emergencies. The signs and symptoms of potential                            delayed complications were discussed with the                            patient.  Return to normal activities tomorrow.                            Written discharge instructions were provided to the                            patient.                           - Resume previous diet.                           - Continue present medications.                           - Await pathology results.                           - Repeat colonoscopy is recommended. The                            colonoscopy date will be determined after pathology  results from today's exam become available for                            review. Jerene Bears, MD 12/06/2018 3:44:35 PM This report has been signed electronically.

## 2018-12-06 NOTE — Patient Instructions (Signed)
   Information on polyps and diverticulosis  given to you today.  Await pathology results on polyps removed   YOU HAD AN ENDOSCOPIC PROCEDURE TODAY AT [facility]:   Refer to the procedure report that was given to you for any specific questions about what was found during the examination.  If the procedure report does not answer your questions, please call your gastroenterologist to clarify.  If you requested that your care partner not be given the details of your procedure findings, then the procedure report has been included in a sealed envelope for you to review at your convenience later.  YOU SHOULD EXPECT: Some feelings of bloating in the abdomen. Passage of more gas than usual.  Walking can help get rid of the air that was put into your GI tract during the procedure and reduce the bloating. If you had a lower endoscopy (such as a colonoscopy or flexible sigmoidoscopy) you may notice spotting of blood in your stool or on the toilet paper. If you underwent a bowel prep for your procedure, you may not have a normal bowel movement for a few days.  Please Note:  You might notice some irritation and congestion in your nose or some drainage.  This is from the oxygen used during your procedure.  There is no need for concern and it should clear up in a day or so.  SYMPTOMS TO REPORT IMMEDIATELY:   Following lower endoscopy (colonoscopy or flexible sigmoidoscopy):  Excessive amounts of blood in the stool  Significant tenderness or worsening of abdominal pains  Swelling of the abdomen that is new, acute  Fever of 100F or higher    For urgent or emergent issues, a gastroenterologist can be reached at any hour by calling [phone].   DIET:  We do recommend a small meal at first, but then you may proceed to your regular diet.  Drink plenty of fluids but you should avoid alcoholic beverages for 24 hours.  ACTIVITY:  You should plan to take it easy for the rest of today and  you should NOT DRIVE or use heavy machinery until tomorrow (because of the sedation medicines used during the test).    FOLLOW UP: Our staff will call the number listed on your records the next business day following your procedure to check on you and address any questions or concerns that you may have regarding the information given to you following your procedure. If we do not reach you, we will leave a message.  However, if you are feeling well and you are not experiencing any problems, there is no need to return our call.  We will assume that you have returned to your regular daily activities without incident.  If any biopsies were taken you will be contacted by phone or by letter within the next 1-3 weeks.  Please call us at [phone] if you have not heard about the biopsies in 3 weeks.    SIGNATURES/CONFIDENTIALITY: You and/or your care partner have signed paperwork which will be entered into your electronic medical record.  These signatures attest to the fact that that the information above on your After Visit Summary has been reviewed and is understood.  Full responsibility of the confidentiality of this discharge information lies with you and/or your care-partner.

## 2018-12-06 NOTE — Progress Notes (Signed)
Called to room to assist during endoscopic procedure.  Patient ID and intended procedure confirmed with present staff. Received instructions for my participation in the procedure from the performing physician.  

## 2018-12-07 ENCOUNTER — Telehealth: Payer: Self-pay

## 2018-12-07 NOTE — Telephone Encounter (Signed)
  Follow up Call-  Call back number 12/06/2018  Post procedure Call Back phone  # [phone]  Permission to leave phone message Yes  Some recent data might be hidden     Patient questions:  Do you have a fever, pain , or abdominal swelling? No. Pain Score  0 *  Have you tolerated food without any problems? Yes.    Have you been able to return to your normal activities? Yes.    Do you have any questions about your discharge instructions: Diet   No. Medications  No. Follow up visit  No.  Do you have questions or concerns about your Care? No.  Actions: * If pain score is 4 or above: No action needed, pain <4.

## 2018-12-08 ENCOUNTER — Ambulatory Visit: Payer: Self-pay | Admitting: Family Medicine

## 2018-12-15 ENCOUNTER — Encounter: Payer: Self-pay | Admitting: Internal Medicine

## 2019-01-06 ENCOUNTER — Ambulatory Visit: Payer: Self-pay | Admitting: Family Medicine

## 2019-01-28 ENCOUNTER — Other Ambulatory Visit: Payer: Self-pay

## 2019-01-28 ENCOUNTER — Ambulatory Visit (INDEPENDENT_AMBULATORY_CARE_PROVIDER_SITE_OTHER): Payer: BLUE CROSS/BLUE SHIELD | Admitting: Family Medicine

## 2019-01-28 ENCOUNTER — Encounter

## 2019-01-28 ENCOUNTER — Encounter: Payer: Self-pay | Admitting: Family Medicine

## 2019-01-28 VITALS — BP 108/80 | HR 84 | Temp 97.8°F | Resp 16 | Ht 73.0 in | Wt 272.2 lb

## 2019-01-28 DIAGNOSIS — Z125 Encounter for screening for malignant neoplasm of prostate: Secondary | ICD-10-CM | POA: Diagnosis not present

## 2019-01-28 DIAGNOSIS — Z0000 Encounter for general adult medical examination without abnormal findings: Secondary | ICD-10-CM | POA: Diagnosis not present

## 2019-01-28 DIAGNOSIS — Z6835 Body mass index (BMI) 35.0-35.9, adult: Secondary | ICD-10-CM

## 2019-01-28 DIAGNOSIS — Z13220 Encounter for screening for lipoid disorders: Secondary | ICD-10-CM | POA: Diagnosis not present

## 2019-01-28 DIAGNOSIS — Z0001 Encounter for general adult medical examination with abnormal findings: Secondary | ICD-10-CM

## 2019-01-28 DIAGNOSIS — Z1329 Encounter for screening for other suspected endocrine disorder: Secondary | ICD-10-CM | POA: Diagnosis not present

## 2019-01-28 DIAGNOSIS — G4733 Obstructive sleep apnea (adult) (pediatric): Secondary | ICD-10-CM

## 2019-01-28 DIAGNOSIS — Z131 Encounter for screening for diabetes mellitus: Secondary | ICD-10-CM | POA: Diagnosis not present

## 2019-01-28 NOTE — Patient Instructions (Addendum)
   I will refer you to sleep specialist as we discussed to evaluate current CPAP settings.  Screening lab work performed today.  Thank you for coming in.  Follow-up in 6 months for physical, let me know if there are questions prior to that time.    If you have lab work done today you will be contacted with your lab results within the next 2 weeks.  If you have not heard from [place] then please contact us. The fastest way to get your results is to register for My Chart.   IF you received an x-ray today, you will receive an invoice from [facility]. Please contact [facility] at [phone] with questions or concerns regarding your invoice.   IF you received labwork today, you will receive an invoice from [facility]. Please contact [facility] at [phone] with questions or concerns regarding your invoice.   Our billing staff will not be able to assist you with questions regarding bills from these companies.  You will be contacted with the lab results as soon as they are available. The fastest way to get your results is to activate your My Chart account. Instructions are located on the last page of this paperwork. If you have not heard from [place] regarding the results in 2 weeks, please contact this office.

## 2019-01-28 NOTE — Progress Notes (Signed)
Subjective:    Patient ID: Steven Silva, male    DOB: [birth year], 58 y.o.   MRN: [id number]  HPI Steven Silva is a 58 y.o. male Presents today for: Chief Complaint  Patient presents with  . Follow-up    patient stated when he was here in Oct,  did not have ins so me and dr Carlota Raspberry decided I would have the colonoscopy then come back for labs which did all the parts for the physical except fot the lab work.. Had Colonscopy done in Jan. still have not got the shingles shot but got flu shot.    Here for follow-up, seen August 20, 2018 for a physical. Colonoscopy in January. 1 polyp - repeat 5 years.  Shingrix vaccine recommended, still on waiting. Did have flu vaccine in September 2019.  Lab work was deferred of October, plans to have done today. Prostate cancer screening with PSA by urologist Dr. Risa Grill approximately 3 years ago reportedly normal.  Denies any new urinary symptoms in October. On CPAP for OSA with stable symptoms in October - still stable. Occasional pulls off. Started 3 years ago. Not sure if settings are right - would like to meet with sleep specialist.  Agrees to prostate cancer screening.    Immunization History  Administered Date(s) Administered  . PPD Test 05/11/2013   Overweight/obesity: Now exercising and strength training. 2.5 hrs cardio per week.  Wt Readings from Last 3 Encounters:  01/28/19 272 lb 3.2 oz (123.5 kg)  12/06/18 273 lb (123.8 kg)  11/15/18 273 lb (123.8 kg)      Patient Active Problem List   Diagnosis Date Noted  . Abscess of buttock, right 01/12/2016   Past Medical History:  Diagnosis Date  . Neuromuscular disorder (HCC)    neuropathy from spinal surgery   . Sleep apnea    wears cpap , no 02   Past Surgical History:  Procedure Laterality Date  . INCISION AND DRAINAGE ABSCESS Left 01/12/2016   Procedure: INCISION AND DRAINAGE ABSCESS buttock abscess;  Surgeon: [name], MD;  Location: [facility];  Service: General;   Laterality: Left;  . KNEE ARTHROSCOPY Bilateral    x 5 total- 3 1 knee, 2 other - pt unsure which   . SPINE SURGERY     ACDF C3-C7   No Known Allergies Prior to Admission medications   Not on File   Social History   Socioeconomic History  . Marital status: Married    Spouse name: Not on file  . Number of children: Not on file  . Years of education: Not on file  . Highest education level: Not on file  Occupational History  . Not on file  Social Needs  . Financial resource strain: Not on file  . Food insecurity:    Worry: Not on file    Inability: Not on file  . Transportation needs:    Medical: Not on file    Non-medical: Not on file  Tobacco Use  . Smoking status: Never Smoker  . Smokeless tobacco: Never Used  Substance and Sexual Activity  . Alcohol use: Yes    Alcohol/week: 0.0 standard drinks    Comment: occ beer   . Drug use: No  . Sexual activity: Yes  Lifestyle  . Physical activity:    Days per week: Not on file    Minutes per session: Not on file  . Stress: Not on file  Relationships  . Social connections:    Talks on  phone: Not on file    Gets together: Not on file    Attends religious service: Not on file    Active member of club or organization: Not on file    Attends meetings of clubs or organizations: Not on file    Relationship status: Not on file  . Intimate partner violence:    Fear of current or ex partner: Not on file    Emotionally abused: Not on file    Physically abused: Not on file    Forced sexual activity: Not on file  Other Topics Concern  . Not on file  Social History Narrative  . Not on file    Review of Systems     Objective:   Physical Exam Vitals signs reviewed.  Constitutional:      Appearance: He is well-developed.  HENT:     Head: Normocephalic and atraumatic.  Eyes:     Pupils: Pupils are equal, round, and reactive to light.  Neck:     Vascular: No carotid bruit or JVD.  Cardiovascular:     Rate and Rhythm:  Normal rate and regular rhythm.     Heart sounds: Normal heart sounds. No murmur.  Pulmonary:     Effort: Pulmonary effort is normal.     Breath sounds: Normal breath sounds. No rales.  Abdominal:     Hernia: There is no hernia in the right inguinal area or left inguinal area.  Genitourinary:    Prostate: Normal.  Skin:    General: Skin is warm and dry.  Neurological:     Mental Status: He is alert and oriented to person, place, and time.    Vitals:   01/28/19 1425  BP: 108/80  Pulse: 84  Resp: 16  Temp: 97.8 F (36.6 C)  TempSrc: Oral  SpO2: 96%  Weight: 272 lb 3.2 oz (123.5 kg)  Height: 6\' 1"  (1.854 m)      Assessment & Plan:  Steven Silva is a 58 y.o. male Annual physical exam - Plan: PSA, Lipid Panel, Comprehensive metabolic panel  - -anticipatory guidance previously provided in AVS, screening labs above. Health maintenance items as above in HPI discussed/recommended as applicable.   Screening for prostate cancer  -We discussed pros and cons of prostate cancer screening, and after this discussion, he chose to have screening done. PSA obtained, and no concerning findings on DRE.   Screening for hyperlipidemia  - labs pending.   Screening for diabetes mellitus - Plan: Comprehensive metabolic panel, Hemoglobin A1c  Screening for thyroid disorder - Plan: TSH BMI 35.0-35.9,adult - Plan: TSH  -Diet/exercise discussed for weight management, check TSH.  Screen for diabetes as above.  OSA (obstructive sleep apnea) - Plan: Ambulatory referral to Sleep Studies  -May need adjustment in settings.  Refer to sleep specialist.  No orders of the defined types were placed in this encounter.  Patient Instructions     I will refer you to sleep specialist as we discussed to evaluate current CPAP settings.  Screening lab work performed today.  Thank you for coming in.  Follow-up in 6 months for physical, let me know if there are questions prior to that time.    If you  have lab work done today you will be contacted with your lab results within the next 2 weeks.  If you have not heard from [place] then please contact us. The fastest way to get your results is to register for My Chart.   IF you received an  x-ray today, you will receive an invoice from [facility]. Please contact [facility] at [phone] with questions or concerns regarding your invoice.   IF you received labwork today, you will receive an invoice from [facility]. Please contact [facility] at [phone] with questions or concerns regarding your invoice.   Our billing staff will not be able to assist you with questions regarding bills from these companies.  You will be contacted with the lab results as soon as they are available. The fastest way to get your results is to activate your My Chart account. Instructions are located on the last page of this paperwork. If you have not heard from [place] regarding the results in 2 weeks, please contact this office.       Signed,   Merri Ray, MD [facility].  01/30/19 11:33 AM

## 2019-01-29 LAB — COMPREHENSIVE METABOLIC PANEL
ALT: 19 IU/L (ref 0–44)
AST: 21 IU/L (ref 0–40)
Albumin/Globulin Ratio: 1.6 (ref 1.2–2.2)
Albumin: 4.5 g/dL (ref 3.8–4.9)
Alkaline Phosphatase: 54 IU/L (ref 39–117)
BUN/Creatinine Ratio: 12 (ref 9–20)
BUN: 12 mg/dL (ref 6–24)
Bilirubin Total: 0.4 mg/dL (ref 0.0–1.2)
CO2: 20 mmol/L (ref 20–29)
Calcium: 9.4 mg/dL (ref 8.7–10.2)
Chloride: 104 mmol/L (ref 96–106)
Creatinine, Ser: 1.03 mg/dL (ref 0.76–1.27)
GFR calc non Af Amer: 80 mL/min/{1.73_m2} (ref >59)
GFR, EST AFRICAN AMERICAN: 92 mL/min/{1.73_m2} (ref >59)
Globulin, Total: 2.8 g/dL (ref 1.5–4.5)
Glucose: 84 mg/dL (ref 65–99)
Potassium: 4.5 mmol/L (ref 3.5–5.2)
Sodium: 141 mmol/L (ref 134–144)
Total Protein: 7.3 g/dL (ref 6.0–8.5)

## 2019-01-29 LAB — TSH: TSH: 7.63 u[IU]/mL — ABNORMAL HIGH (ref 0.450–4.500)

## 2019-01-29 LAB — HEMOGLOBIN A1C
Est. average glucose Bld gHb Est-mCnc: 114 mg/dL
Hgb A1c MFr Bld: 5.6 % (ref 4.8–5.6)

## 2019-01-29 LAB — LIPID PANEL
Chol/HDL Ratio: 2.5 ratio (ref 0.0–5.0)
Cholesterol, Total: 163 mg/dL (ref 100–199)
HDL: 66 mg/dL (ref >39)
LDL Calculated: 87 mg/dL (ref 0–99)
Triglycerides: 49 mg/dL (ref 0–149)
VLDL Cholesterol Cal: 10 mg/dL (ref 5–40)

## 2019-01-29 LAB — PSA: Prostate Specific Ag, Serum: 0.7 ng/mL (ref 0.0–4.0)

## 2019-01-30 ENCOUNTER — Encounter: Payer: Self-pay | Admitting: Family Medicine

## 2019-01-31 ENCOUNTER — Encounter: Payer: Self-pay | Admitting: Radiology

## 2019-02-14 ENCOUNTER — Telehealth: Payer: Self-pay | Admitting: Family Medicine

## 2019-04-26 NOTE — Telephone Encounter (Signed)
error 

## 2019-08-25 ENCOUNTER — Encounter: Payer: BLUE CROSS/BLUE SHIELD | Admitting: Family Medicine

## 2019-08-26 ENCOUNTER — Encounter: Payer: Self-pay | Admitting: Family Medicine

## 2019-12-07 ENCOUNTER — Other Ambulatory Visit: Payer: Self-pay

## 2019-12-07 ENCOUNTER — Ambulatory Visit (INDEPENDENT_AMBULATORY_CARE_PROVIDER_SITE_OTHER): Payer: BC Managed Care – PPO | Admitting: Family Medicine

## 2019-12-07 VITALS — BP 140/96 | HR 18 | Temp 98.7°F | Ht 73.0 in | Wt 280.0 lb

## 2019-12-07 DIAGNOSIS — Z0000 Encounter for general adult medical examination without abnormal findings: Secondary | ICD-10-CM

## 2019-12-07 DIAGNOSIS — R7989 Other specified abnormal findings of blood chemistry: Secondary | ICD-10-CM | POA: Diagnosis not present

## 2019-12-07 DIAGNOSIS — Z0001 Encounter for general adult medical examination with abnormal findings: Secondary | ICD-10-CM | POA: Diagnosis not present

## 2019-12-07 DIAGNOSIS — Z131 Encounter for screening for diabetes mellitus: Secondary | ICD-10-CM | POA: Diagnosis not present

## 2019-12-07 DIAGNOSIS — Z13220 Encounter for screening for lipoid disorders: Secondary | ICD-10-CM

## 2019-12-07 DIAGNOSIS — Z6836 Body mass index (BMI) 36.0-36.9, adult: Secondary | ICD-10-CM | POA: Diagnosis not present

## 2019-12-07 DIAGNOSIS — R030 Elevated blood-pressure reading, without diagnosis of hypertension: Secondary | ICD-10-CM

## 2019-12-07 NOTE — Patient Instructions (Addendum)
Try to incorporate some form of exercise into day most days per week. Walking, low intensity on elliptical with goal of 150 mins per week. Start low, go slow with intensity.  Cut back on fast food as able. Water is best for beverages. Watch portion sizes and recheck in 3 months.   I will try to get you on a wait list fo covid vacccine.   Keep a record of your blood pressures outside of the office and will discuss at telemed visit in few weeks.  Return to the clinic or go to the nearest emergency room if any new symptoms occur.  Follow up with urology as planned. I did not do prostate testing today.    How to Take Your Blood Pressure Blood pressure is a measurement of how strongly your blood is pressing against the walls of your arteries. Arteries are blood vessels that carry blood from your heart throughout your body. Your health care provider takes your blood pressure at each office visit. You can also take your own blood pressure at home with a blood pressure machine. You may need to take your own blood pressure:  To confirm a diagnosis of high blood pressure (hypertension).  To monitor your blood pressure over time.  To make sure your blood pressure medicine is working. Supplies needed: To take your blood pressure, you will need a blood pressure machine. You can buy a blood pressure machine, or blood pressure monitor, at most drugstores or online. There are several types of home blood pressure monitors. When choosing one, consider the following:  Choose a monitor that has an arm cuff.  Choose a cuff that wraps snugly around your upper arm. You should be able to fit only one finger between your arm and the cuff.  Do not choose a monitor that measures your blood pressure from your wrist or finger. Your health care provider can suggest a reliable monitor that will meet your needs. How to prepare To get the most accurate reading, avoid the following for 30 minutes before you check your  blood pressure:  Drinking caffeine.  Drinking alcohol.  Eating.  Smoking.  Exercising. Five minutes before you check your blood pressure:  Empty your bladder.  Sit quietly without talking in a dining chair, rather than in a soft couch or armchair. How to take your blood pressure To check your blood pressure, follow the instructions in the manual that came with your blood pressure monitor. If you have a digital blood pressure monitor, the instructions may be as follows: 1. Sit up straight. 2. Place your feet on the floor. Do not cross your ankles or legs. 3. Rest your left arm at the level of your heart on a table or desk or on the arm of a chair. 4. Pull up your shirt sleeve. 5. Wrap the blood pressure cuff around the upper part of your left arm, 1 inch (2.5 cm) above your elbow. It is best to wrap the cuff around bare skin. 6. Fit the cuff snugly around your arm. You should be able to place only one finger between the cuff and your arm. 7. Position the cord inside the groove of your elbow. 8. Press the power button. 9. Sit quietly while the cuff inflates and deflates. 10. Read the digital reading on the monitor screen and write it down (record it). 11. Wait 2-3 minutes, then repeat the steps, starting at step 1. What does my blood pressure reading mean? A blood pressure reading consists of  a higher number over a lower number. Ideally, your blood pressure should be below 120/80. The first ("top") number is called the systolic pressure. It is a measure of the pressure in your arteries as your heart beats. The second ("bottom") number is called the diastolic pressure. It is a measure of the pressure in your arteries as the heart relaxes. Blood pressure is classified into four stages. The following are the stages for adults who do not have a short-term serious illness or a chronic condition. Systolic pressure and diastolic pressure are measured in a unit called mm Hg. Normal  Systolic  pressure: below 120.  Diastolic pressure: below 80. Elevated  Systolic pressure: [id number].  Diastolic pressure: below 80. Hypertension stage 1  Systolic pressure: 0000000.  Diastolic pressure: [id number]. Hypertension stage 2  Systolic pressure: [id number] or above.  Diastolic pressure: 90 or above. You can have prehypertension or hypertension even if only the systolic or only the diastolic number in your reading is higher than normal. Follow these instructions at home:  Check your blood pressure as often as recommended by your health care provider.  Take your monitor to the next appointment with your health care provider to make sure: ? That you are using it correctly. ? That it provides accurate readings.  Be sure you understand what your goal blood pressure numbers are.  Tell your health care provider if you are having any side effects from blood pressure medicine. Contact a health care provider if:  Your blood pressure is consistently high. Get help right away if:  Your systolic blood pressure is higher than 180.  Your diastolic blood pressure is higher than 110. This information is not intended to replace advice given to you by your health care provider. Make sure you discuss any questions you have with your health care provider. Document Revised: 10/16/2017 Document Reviewed: 04/11/2016 Elsevier Patient Education  2020 [place] [employer]  Get these tests  Blood pressure- Have your blood pressure checked once a year by your healthcare provider.  Normal blood pressure is 120/80  Weight- Have your body mass index (BMI) calculated to screen for obesity.  BMI is a measure of body fat based on height and weight. You can also calculate your own BMI at [email or web address].  Cholesterol- Have your cholesterol checked every year.  Diabetes- Have your blood sugar checked regularly if you have high blood pressure, high cholesterol, have a family history of  diabetes or if you are overweight.  Screening for Colon Cancer- Colonoscopy starting at age 23.  Screening may begin sooner depending on your family history and other health conditions. Follow up colonoscopy as directed by your Gastroenterologist.  Screening for Prostate Cancer- Both blood work (PSA) and a rectal exam help screen for Prostate Cancer.  Screening begins at age 26 with African-American men and at age 29 with Caucasian men.  Screening may begin sooner depending on your family history.  Take these medicines  Aspirin- One aspirin daily can help prevent Heart disease and Stroke.  Flu shot- Every fall.  Tetanus- Every 10 years.  Zostavax- Once after the age of 26 to prevent Shingles.  Pneumonia shot- Once after the age of 33; if you are younger than 54, ask your healthcare provider if you need a Pneumonia shot.  Take these steps  Don't smoke- If you do smoke, talk to your doctor about quitting.  For tips on how to quit, go to www.smokefree.gov or call 1-800-QUIT-NOW.  Be physically active-  Exercise 5 days a week for at least 30 minutes.  If you are not already physically active start slow and gradually work up to 30 minutes of moderate physical activity.  Examples of moderate activity include walking briskly, mowing the yard, dancing, swimming, bicycling, etc.  Eat a healthy diet- Eat a variety of healthy food such as fruits, vegetables, low fat milk, low fat cheese, yogurt, lean meant, poultry, fish, beans, tofu, etc. For more information go to www.thenutritionsource.org  Drink alcohol in moderation- Limit alcohol intake to less than two drinks a day. Never drink and drive.  Dentist- Brush and floss twice daily; visit your dentist twice a year.  Depression- Your emotional health is as important as your physical health. If you're feeling down, or losing interest in things you would normally enjoy please talk to your healthcare provider.  Eye exam- Visit your eye doctor every  year.  Safe sex- If you may be exposed to a sexually transmitted infection, use a condom.  Seat belts- Seat belts can save your life; always wear one.  Smoke/Carbon Monoxide detectors- These detectors need to be installed on the appropriate level of your home.  Replace batteries at least once a year.  Skin cancer- When out in the sun, cover up and use sunscreen 15 SPF or higher.  Violence- If anyone is threatening you, please tell your healthcare provider.  Living Will/ Health care power of attorney- Speak with your healthcare provider and family.  If you have lab work done today you will be contacted with your lab results within the next 2 weeks.  If you have not heard from [place] then please contact us. The fastest way to get your results is to register for My Chart.   IF you received an x-ray today, you will receive an invoice from [facility]. Please contact [facility] at [phone] with questions or concerns regarding your invoice.   IF you received labwork today, you will receive an invoice from [facility]. Please contact [facility] at [phone] with questions or concerns regarding your invoice.   Our billing staff will not be able to assist you with questions regarding bills from these companies.  You will be contacted with the lab results as soon as they are available. The fastest way to get your results is to activate your My Chart account. Instructions are located on the last page of this paperwork. If you have not heard from [place] regarding the results in 2 weeks, please contact this office.

## 2019-12-07 NOTE — Progress Notes (Signed)
Subjective:  Patient ID: Steven Silva, male    DOB: [birth year]  Age: 59 y.o. MRN: [id number]  CC:  Chief Complaint  Patient presents with  . Annual Exam    pt feels good. pt states he knows he's mot in the best of health, but he states he feels good  and he is taking steps to improve his health.    HPI Steven Silva presents for   Annual physical exam. Last physical in 01/2019.  No current meds.   Obesity: Body mass index is 36.94 kg/m. Wt Readings from Last 3 Encounters:  12/07/19 280 lb (127 kg)  01/28/19 272 lb 3.2 oz (123.5 kg)  12/06/18 273 lb (123.8 kg)   Lab Results  Component Value Date   HGBA1C 5.6 01/28/2019  decreased exercise.  Has been able to work during pandemic (his [place]). Hard time getting exercise, gyms closed. Elliptical at office.  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  1/2 and 1/2 sweet tea. Minimal sweeteners.  Fast food - 3 per week.   OSA on CPAP.  Referred to sleep specialist last year to eval for possible setting changes.   Elevated TSH: Lab Results  Component Value Date   TSH 7.630 (H) 01/28/2019  recommended 6 week follow up in March 2020. Has not been rechecked. No new hot or cold intolerance. No new hair or skin changes, heart palpitations or new fatigue. No new unexpected weight changes.   Elevated BP.  Has meter at home with some elvated readings at times. no symptoms. No current meds. Weight has increased as above.  BP Readings from Last 3 Encounters:  12/07/19 (!) 140/96  01/28/19 108/80  12/06/18 137/80     Cancer screening: Colonoscopy 12/06/18 - repeat 5 years.  Prostate, no family history.  Followed by urology, erectile dysfunction with prior nerve damage/neuropathy. Testing deferred - plans to see urology.    Lab Results  Component Value Date   PSA1 0.7 01/28/2019   Immunization History  Administered Date(s) Administered  . PPD Test 05/11/2013  declines flu  vaccine. Plans on delaying shingles vaccine until after covid.    Depression screen [facility] 2/9 12/07/2019 01/28/2019 08/20/2018 01/12/2016 12/02/2015  Decreased Interest 0 0 0 0 0  Down, Depressed, Hopeless 0 0 0 0 0  PHQ - 2 Score 0 0 0 0 0   Dental: every 6 months.  Exercise: minimal as above.   Hep c and Hiv test today.   History Patient Active Problem List   Diagnosis Date Noted  . Abscess of buttock, right 01/12/2016   Past Medical History:  Diagnosis Date  . Neuromuscular disorder (HCC)    neuropathy from spinal surgery   . Sleep apnea    wears cpap , no 02   Past Surgical History:  Procedure Laterality Date  . INCISION AND DRAINAGE ABSCESS Left 01/12/2016   Procedure: INCISION AND DRAINAGE ABSCESS buttock abscess;  Surgeon: [name], MD;  Location: [facility];  Service: General;  Laterality: Left;  . KNEE ARTHROSCOPY Bilateral    x 5 total- 3 1 knee, 2 other - pt unsure which   . SPINE SURGERY     ACDF C3-C7   No Known Allergies Prior to Admission medications   Not on File   Social History   Socioeconomic History  . Marital status: Married    Spouse name: Not on file  . Number of children: Not on file  . Years  of education: Not on file  . Highest education level: Not on file  Occupational History  . Not on file  Tobacco Use  . Smoking status: Never Smoker  . Smokeless tobacco: Never Used  Substance and Sexual Activity  . Alcohol use: Yes    Alcohol/week: 0.0 standard drinks    Comment: occ beer   . Drug use: No  . Sexual activity: Yes  Other Topics Concern  . Not on file  Social History Narrative  . Not on file   Social Determinants of Health   Financial Resource Strain:   . Difficulty of Paying Living Expenses: Not on file  Food Insecurity:   . Worried About Charity fundraiser in the Last Year: Not on file  . Ran Out of Food in the Last Year: Not on file  Transportation Needs:   . Lack of Transportation (Medical): Not on file  . Lack of  Transportation (Non-Medical): Not on file  Physical Activity:   . Days of Exercise per Week: Not on file  . Minutes of Exercise per Session: Not on file  Stress:   . Feeling of Stress : Not on file  Social Connections:   . Frequency of Communication with Friends and Family: Not on file  . Frequency of Social Gatherings with Friends and Family: Not on file  . Attends Religious Services: Not on file  . Active Member of Clubs or Organizations: Not on file  . Attends Archivist Meetings: Not on file  . Marital Status: Not on file  Intimate Partner Violence:   . Fear of Current or Ex-Partner: Not on file  . Emotionally Abused: Not on file  . Physically Abused: Not on file  . Sexually Abused: Not on file    Review of Systems  13 point review of systems per patient health survey.  Negative other than as indicated above or in HPI.   Objective:   Vitals:   12/07/19 1046 12/07/19 1050  BP: (!) 175/96 (!) 140/96  Pulse: (!) 18   Temp: 98.7 F (37.1 C)   TempSrc: Temporal   SpO2: 94%   Weight: 280 lb (127 kg)   Height: 6\' 1"  (1.854 m)      Physical Exam Vitals reviewed.  Constitutional:      General: He is not in acute distress.    Appearance: He is well-developed. He is obese. He is not ill-appearing, toxic-appearing or diaphoretic.  HENT:     Head: Normocephalic and atraumatic.     Right Ear: External ear normal.     Left Ear: External ear normal.  Eyes:     Conjunctiva/sclera: Conjunctivae normal.     Pupils: Pupils are equal, round, and reactive to light.  Neck:     Thyroid: No thyromegaly.  Cardiovascular:     Rate and Rhythm: Normal rate and regular rhythm.     Heart sounds: Normal heart sounds.  Pulmonary:     Effort: Pulmonary effort is normal. No respiratory distress.     Breath sounds: Normal breath sounds. No wheezing.  Abdominal:     General: There is no distension.     Palpations: Abdomen is soft.     Tenderness: There is no abdominal  tenderness.  Musculoskeletal:        General: No tenderness. Normal range of motion.     Cervical back: Normal range of motion and neck supple.  Lymphadenopathy:     Cervical: No cervical adenopathy.  Skin:  General: Skin is warm and dry.  Neurological:     Mental Status: He is alert and oriented to person, place, and time.     Deep Tendon Reflexes: Reflexes are normal and symmetric.  Psychiatric:        Behavior: Behavior normal.      Assessment & Plan:  Steven Silva is a 59 y.o. male . Annual physical exam - Plan: HIV Antibody (routine testing w rflx), Hepatitis C antibody  BMI 36.0-36.9,adult - Plan: Hemoglobin A1c  Elevated TSH - Plan: TSH + free T4  Screening for diabetes mellitus - Plan: Hemoglobin A1c, Comprehensive metabolic panel  Screening for hyperlipidemia - Plan: Lipid panel  Elevated blood pressure reading without diagnosis of hypertension  -anticipatory guidance as below in AVS, screening labs above. Health maintenance items as above in HPI discussed/recommended as applicable.  -Increase exercise with low intensity initially, should help with both weight, blood pressure.  Dietary recommendations also discussed -Check A1c -Borderline TSH prior, asymptomatic, repeat testing -Elevated blood pressure, monitor home readings, repeat virtual visit 2 weeks.  Asymptomatic at present, RTC/ER precautions given   No orders of the defined types were placed in this encounter.  Patient Instructions    Try to incorporate some form of exercise into day most days per week. Walking, low intensity on elliptical with goal of 150 mins per week. Start low, go slow with intensity.  Cut back on fast food as able. Water is best for beverages. Watch portion sizes and recheck in 3 months.   I will try to get you on a wait list fo covid vacccine.   Keep a record of your blood pressures outside of the office and will discuss at telemed visit in few weeks.  Return to the clinic  or go to the nearest emergency room if any new symptoms occur.  Follow up with urology as planned. I did not do prostate testing today.    How to Take Your Blood Pressure Blood pressure is a measurement of how strongly your blood is pressing against the walls of your arteries. Arteries are blood vessels that carry blood from your heart throughout your body. Your health care provider takes your blood pressure at each office visit. You can also take your own blood pressure at home with a blood pressure machine. You may need to take your own blood pressure:  To confirm a diagnosis of high blood pressure (hypertension).  To monitor your blood pressure over time.  To make sure your blood pressure medicine is working. Supplies needed: To take your blood pressure, you will need a blood pressure machine. You can buy a blood pressure machine, or blood pressure monitor, at most drugstores or online. There are several types of home blood pressure monitors. When choosing one, consider the following:  Choose a monitor that has an arm cuff.  Choose a cuff that wraps snugly around your upper arm. You should be able to fit only one finger between your arm and the cuff.  Do not choose a monitor that measures your blood pressure from your wrist or finger. Your health care provider can suggest a reliable monitor that will meet your needs. How to prepare To get the most accurate reading, avoid the following for 30 minutes before you check your blood pressure:  Drinking caffeine.  Drinking alcohol.  Eating.  Smoking.  Exercising. Five minutes before you check your blood pressure:  Empty your bladder.  Sit quietly without talking in a dining chair, rather than  in a soft couch or armchair. How to take your blood pressure To check your blood pressure, follow the instructions in the manual that came with your blood pressure monitor. If you have a digital blood pressure monitor, the instructions may be  as follows: 1. Sit up straight. 2. Place your feet on the floor. Do not cross your ankles or legs. 3. Rest your left arm at the level of your heart on a table or desk or on the arm of a chair. 4. Pull up your shirt sleeve. 5. Wrap the blood pressure cuff around the upper part of your left arm, 1 inch (2.5 cm) above your elbow. It is best to wrap the cuff around bare skin. 6. Fit the cuff snugly around your arm. You should be able to place only one finger between the cuff and your arm. 7. Position the cord inside the groove of your elbow. 8. Press the power button. 9. Sit quietly while the cuff inflates and deflates. 10. Read the digital reading on the monitor screen and write it down (record it). 11. Wait 2-3 minutes, then repeat the steps, starting at step 1. What does my blood pressure reading mean? A blood pressure reading consists of a higher number over a lower number. Ideally, your blood pressure should be below 120/80. The first ("top") number is called the systolic pressure. It is a measure of the pressure in your arteries as your heart beats. The second ("bottom") number is called the diastolic pressure. It is a measure of the pressure in your arteries as the heart relaxes. Blood pressure is classified into four stages. The following are the stages for adults who do not have a short-term serious illness or a chronic condition. Systolic pressure and diastolic pressure are measured in a unit called mm Hg. Normal  Systolic pressure: below 123456.  Diastolic pressure: below 80. Elevated  Systolic pressure: [id number].  Diastolic pressure: below 80. Hypertension stage 1  Systolic pressure: 0000000.  Diastolic pressure: [id number]. Hypertension stage 2  Systolic pressure: XX123456 or above.  Diastolic pressure: 90 or above. You can have prehypertension or hypertension even if only the systolic or only the diastolic number in your reading is higher than normal. Follow these instructions at  home:  Check your blood pressure as often as recommended by your health care provider.  Take your monitor to the next appointment with your health care provider to make sure: ? That you are using it correctly. ? That it provides accurate readings.  Be sure you understand what your goal blood pressure numbers are.  Tell your health care provider if you are having any side effects from blood pressure medicine. Contact a health care provider if:  Your blood pressure is consistently high. Get help right away if:  Your systolic blood pressure is higher than 180.  Your diastolic blood pressure is higher than 110. This information is not intended to replace advice given to you by your health care provider. Make sure you discuss any questions you have with your health care provider. Document Revised: 10/16/2017 Document Reviewed: 04/11/2016 Elsevier Patient Education  2020 [place] you healthy  Get these tests  Blood pressure- Have your blood pressure checked once a year by your healthcare provider.  Normal blood pressure is 120/80  Weight- Have your body mass index (BMI) calculated to screen for obesity.  BMI is a measure of body fat based on height and weight. You can also calculate your own BMI  at [employer].  Cholesterol- Have your cholesterol checked every year.  Diabetes- Have your blood sugar checked regularly if you have high blood pressure, high cholesterol, have a family history of diabetes or if you are overweight.  Screening for Colon Cancer- Colonoscopy starting at age 2.  Screening may begin sooner depending on your family history and other health conditions. Follow up colonoscopy as directed by your Gastroenterologist.  Screening for Prostate Cancer- Both blood work (PSA) and a rectal exam help screen for Prostate Cancer.  Screening begins at age 71 with African-American men and at age 107 with Caucasian men.  Screening may begin sooner  depending on your family history.  Take these medicines  Aspirin- One aspirin daily can help prevent Heart disease and Stroke.  Flu shot- Every fall.  Tetanus- Every 10 years.  Zostavax- Once after the age of 40 to prevent Shingles.  Pneumonia shot- Once after the age of 37; if you are younger than 57, ask your healthcare provider if you need a Pneumonia shot.  Take these steps  Don't smoke- If you do smoke, talk to your doctor about quitting.  For tips on how to quit, go to www.smokefree.gov or call 1-800-QUIT-NOW.  Be physically active- Exercise 5 days a week for at least 30 minutes.  If you are not already physically active start slow and gradually work up to 30 minutes of moderate physical activity.  Examples of moderate activity include walking briskly, mowing the yard, dancing, swimming, bicycling, etc.  Eat a healthy diet- Eat a variety of healthy food such as fruits, vegetables, low fat milk, low fat cheese, yogurt, lean meant, poultry, fish, beans, tofu, etc. For more information go to www.thenutritionsource.org  Drink alcohol in moderation- Limit alcohol intake to less than two drinks a day. Never drink and drive.  Dentist- Brush and floss twice daily; visit your dentist twice a year.  Depression- Your emotional health is as important as your physical health. If you're feeling down, or losing interest in things you would normally enjoy please talk to your healthcare provider.  Eye exam- Visit your eye doctor every year.  Safe sex- If you may be exposed to a sexually transmitted infection, use a condom.  Seat belts- Seat belts can save your life; always wear one.  Smoke/Carbon Monoxide detectors- These detectors need to be installed on the appropriate level of your home.  Replace batteries at least once a year.  Skin cancer- When out in the sun, cover up and use sunscreen 15 SPF or higher.  Violence- If anyone is threatening you, please tell your healthcare  provider.  Living Will/ Health care power of attorney- Speak with your healthcare provider and family.  If you have lab work done today you will be contacted with your lab results within the next 2 weeks.  If you have not heard from [place] then please contact us. The fastest way to get your results is to register for My Chart.   IF you received an x-ray today, you will receive an invoice from [facility]. Please contact [facility] at [phone] with questions or concerns regarding your invoice.   IF you received labwork today, you will receive an invoice from [facility]. Please contact [facility] at [phone] with questions or concerns regarding your invoice.   Our billing staff will not be able to assist you with questions regarding bills from these companies.  You will be contacted with the lab results as soon as they are available. The fastest way to  get your results is to activate your My Chart account. Instructions are located on the last page of this paperwork. If you have not heard from [place] regarding the results in 2 weeks, please contact this office.         Signed, Merri Ray, MD [facility]

## 2019-12-08 LAB — COMPREHENSIVE METABOLIC PANEL
ALT: 14 IU/L (ref 0–44)
AST: 14 IU/L (ref 0–40)
Albumin/Globulin Ratio: 1.5 (ref 1.2–2.2)
Albumin: 4.3 g/dL (ref 3.8–4.9)
Alkaline Phosphatase: 63 IU/L (ref 39–117)
BUN/Creatinine Ratio: 10 (ref 9–20)
BUN: 11 mg/dL (ref 6–24)
Bilirubin Total: 0.5 mg/dL (ref 0.0–1.2)
CO2: 20 mmol/L (ref 20–29)
Calcium: 9.3 mg/dL (ref 8.7–10.2)
Chloride: 105 mmol/L (ref 96–106)
Creatinine, Ser: 1.05 mg/dL (ref 0.76–1.27)
GFR calc Af Amer: 89 mL/min/{1.73_m2} (ref >59)
GFR calc non Af Amer: 77 mL/min/{1.73_m2} (ref >59)
Globulin, Total: 2.9 g/dL (ref 1.5–4.5)
Glucose: 87 mg/dL (ref 65–99)
Potassium: 4.2 mmol/L (ref 3.5–5.2)
Sodium: 137 mmol/L (ref 134–144)
Total Protein: 7.2 g/dL (ref 6.0–8.5)

## 2019-12-08 LAB — TSH+FREE T4
Free T4: 1.28 ng/dL (ref 0.82–1.77)
TSH: 7.28 u[IU]/mL — ABNORMAL HIGH (ref 0.450–4.500)

## 2019-12-08 LAB — HIV ANTIBODY (ROUTINE TESTING W REFLEX): HIV Screen 4th Generation wRfx: NONREACTIVE

## 2019-12-08 LAB — LIPID PANEL
Chol/HDL Ratio: 2.4 ratio (ref 0.0–5.0)
Cholesterol, Total: 166 mg/dL (ref 100–199)
HDL: 69 mg/dL (ref >39)
LDL Chol Calc (NIH): 86 mg/dL (ref 0–99)
Triglycerides: 53 mg/dL (ref 0–149)
VLDL Cholesterol Cal: 11 mg/dL (ref 5–40)

## 2019-12-08 LAB — HEMOGLOBIN A1C
Est. average glucose Bld gHb Est-mCnc: 108 mg/dL
Hgb A1c MFr Bld: 5.4 % (ref 4.8–5.6)

## 2019-12-08 LAB — HEPATITIS C ANTIBODY: Hep C Virus Ab: 0.2 s/co ratio (ref 0.0–0.9)

## 2019-12-21 ENCOUNTER — Telehealth (INDEPENDENT_AMBULATORY_CARE_PROVIDER_SITE_OTHER): Payer: BC Managed Care – PPO | Admitting: Family Medicine

## 2019-12-21 ENCOUNTER — Other Ambulatory Visit: Payer: Self-pay

## 2019-12-21 VITALS — BP 130/82

## 2019-12-21 DIAGNOSIS — R7989 Other specified abnormal findings of blood chemistry: Secondary | ICD-10-CM | POA: Diagnosis not present

## 2019-12-21 DIAGNOSIS — R030 Elevated blood-pressure reading, without diagnosis of hypertension: Secondary | ICD-10-CM

## 2019-12-21 NOTE — Progress Notes (Signed)
Virtual Visit via Video Note  I connected with Steven Silva on 12/21/19 at 1:58 PM by a video enabled telemedicine application [employer] and verified that I am speaking with the correct person using two identifiers.   I discussed the limitations, risks, security and privacy concerns of performing an evaluation and management service by telephone and the availability of in person appointments. I also discussed with the patient that there may be a patient responsible charge related to this service. The patient expressed understanding and agreed to proceed, consent obtained  Chief complaint:  Chief Complaint  Patient presents with  . Follow-up     on pt's hypertension. pt has no phisical symptoms of his hyper tention. pt states he feels more energetic since last visit. also wants to go over lab work from last visit.     History of Present Illness: Steven Silva is a 59 y.o. male Elevated blood pressure Follow-up from annual physical exam January 20.  At that time had elevated blood pressure reading without diagnosis of hypertension.  Plan to increase in low intensity exercise, sodium monitoring, home monitoring of blood pressures. Since last visit blood pressures have been better.  132/82 yesterday.  High of 140/86. 138/85.  Drinking more water and has been getting on elliptical, preparing food at home.   Elevated TSH Slight elevation at recent physical.  Similar readings at 7.63 in March 2020. Lab Results  Component Value Date   TSH 7.280 (H) 12/07/2019  Free T4 1.28  Less active last year.  GM and brother on thyroid meds.  Feels fine.  Taking medication daily.  No new hot or cold intolerance. No new hair or skin changes, heart palpitations or new fatigue. No new weight changes.   Has Covid vaccine scheduled this for this Saturday.     Patient Active Problem List   Diagnosis Date Noted  . Abscess of buttock, right 01/12/2016   Past Medical History:  Diagnosis Date   . Neuromuscular disorder (HCC)    neuropathy from spinal surgery   . Sleep apnea    wears cpap , no 02   Past Surgical History:  Procedure Laterality Date  . INCISION AND DRAINAGE ABSCESS Left 01/12/2016   Procedure: INCISION AND DRAINAGE ABSCESS buttock abscess;  Surgeon: [name], MD;  Location: [facility];  Service: General;  Laterality: Left;  . KNEE ARTHROSCOPY Bilateral    x 5 total- 3 1 knee, 2 other - pt unsure which   . SPINE SURGERY     ACDF C3-C7   No Known Allergies Prior to Admission medications   Not on File   Social History   Socioeconomic History  . Marital status: Married    Spouse name: Not on file  . Number of children: Not on file  . Years of education: Not on file  . Highest education level: Not on file  Occupational History  . Not on file  Tobacco Use  . Smoking status: Never Smoker  . Smokeless tobacco: Never Used  Substance and Sexual Activity  . Alcohol use: Yes    Alcohol/week: 0.0 standard drinks    Comment: occ beer   . Drug use: No  . Sexual activity: Yes  Other Topics Concern  . Not on file  Social History Narrative  . Not on file   Social Determinants of Health   Financial Resource Strain:   . Difficulty of Paying Living Expenses: Not on file  Food Insecurity:   . Worried About Estate manager/land agent  of Food in the Last Year: Not on file  . Ran Out of Food in the Last Year: Not on file  Transportation Needs:   . Lack of Transportation (Medical): Not on file  . Lack of Transportation (Non-Medical): Not on file  Physical Activity:   . Days of Exercise per Week: Not on file  . Minutes of Exercise per Session: Not on file  Stress:   . Feeling of Stress : Not on file  Social Connections:   . Frequency of Communication with Friends and Family: Not on file  . Frequency of Social Gatherings with Friends and Family: Not on file  . Attends Religious Services: Not on file  . Active Member of Clubs or Organizations: Not on file  . Attends  Archivist Meetings: Not on file  . Marital Status: Not on file  Intimate Partner Violence:   . Fear of Current or Ex-Partner: Not on file  . Emotionally Abused: Not on file  . Physically Abused: Not on file  . Sexually Abused: Not on file    Observations/Objective: Vitals:   12/21/19 1301  BP: 130/82  Nontoxic-appearing, no distress, normal speech, all questions answered with understanding expressed.   Assessment and Plan: Elevated blood pressure reading  -Improving with exercise, changes in diet.  Commended on efforts, update by MyChart next few weeks.  Elevated TSH - Plan: TSH + free T4  -Borderline elevation, but normal free T4, asymptomatic.  Recheck 6 weeks.  Subclinical hypothyroidism possible.  Follow Up Instructions: 6 weeks lab only visit.   I discussed the assessment and treatment plan with the patient. The patient was provided an opportunity to ask questions and all were answered. The patient agreed with the plan and demonstrated an understanding of the instructions.   The patient was advised to call back or seek an in-person evaluation if the symptoms worsen or if the condition fails to improve as anticipated.  I provided 15 minutes of non-face-to-face time during this encounter.   [name], MD

## 2019-12-21 NOTE — Patient Instructions (Signed)
Good talking to you today.  Return for lab only visit for thyroid test in 6 weeks, but send me a few blood pressure readings over the next 2 to 3 weeks by Mychart.  Keep up the good work with the activity and changes in diet.  Let me know if there are questions.

## 2021-03-18 ENCOUNTER — Other Ambulatory Visit: Payer: Self-pay | Admitting: Orthopedic Surgery

## 2021-03-18 DIAGNOSIS — M545 Low back pain, unspecified: Secondary | ICD-10-CM

## 2021-03-28 ENCOUNTER — Ambulatory Visit
Admission: RE | Admit: 2021-03-28 | Discharge: 2021-03-28 | Disposition: A | Discharge status: Home / Self Care | Payer: 59 | Source: Ambulatory Visit | Attending: Orthopedic Surgery | Admitting: Orthopedic Surgery

## 2021-03-28 DIAGNOSIS — M545 Low back pain, unspecified: Secondary | ICD-10-CM

## 2021-06-03 ENCOUNTER — Other Ambulatory Visit: Payer: Self-pay | Admitting: Neurosurgery

## 2021-06-03 DIAGNOSIS — G959 Disease of spinal cord, unspecified: Secondary | ICD-10-CM

## 2021-06-14 ENCOUNTER — Ambulatory Visit
Admission: RE | Admit: 2021-06-14 | Discharge: 2021-06-14 | Disposition: A | Discharge status: Home / Self Care | Payer: 59 | Source: Ambulatory Visit | Attending: Neurosurgery | Admitting: Neurosurgery

## 2021-06-14 ENCOUNTER — Other Ambulatory Visit: Payer: Self-pay

## 2021-06-14 DIAGNOSIS — G959 Disease of spinal cord, unspecified: Secondary | ICD-10-CM

## 2022-09-15 ENCOUNTER — Ambulatory Visit: Payer: 59 | Admitting: Family Medicine

## 2022-09-15 ENCOUNTER — Encounter: Payer: Self-pay | Admitting: Family Medicine

## 2022-09-15 VITALS — BP 134/78 | HR 73 | Temp 98.4°F | Ht 73.0 in | Wt 282.2 lb

## 2022-09-15 DIAGNOSIS — K429 Umbilical hernia without obstruction or gangrene: Secondary | ICD-10-CM

## 2022-09-15 NOTE — Progress Notes (Unsigned)
Subjective:  Patient ID: Steven Silva, male    DOB: [birth year]  Age: 61 y.o. MRN: [id number]  CC:  Chief Complaint  Patient presents with   hernia    Pt is concern he may have a hernia     HPI Steven Silva presents for   Possible hernia - umbilical Thought was d/t weight, more pronounced since beginning of summer. Min soreness to push on it.Marland Kitchenable to reduce. Some swelling in center of stomach with sitting up - past few months,   No n/v, no bowel changes, no skin redness or changes.  No hx of hernia surgery.  No frequent heavy lifting.   History Patient Active Problem List   Diagnosis Date Noted   Abscess of buttock, right 01/12/2016   Past Medical History:  Diagnosis Date   Neuromuscular disorder ([place])    neuropathy from spinal surgery    Sleep apnea    wears cpap , no 02   Past Surgical History:  Procedure Laterality Date   INCISION AND DRAINAGE ABSCESS Left 01/12/2016   Procedure: INCISION AND DRAINAGE ABSCESS buttock abscess;  Surgeon: [name], MD;  Location: [facility];  Service: General;  Laterality: Left;   KNEE ARTHROSCOPY Bilateral    x 5 total- 3 1 knee, 2 other - pt unsure which    SPINE SURGERY     ACDF C3-C7   No Known Allergies Prior to Admission medications   Not on File   Social History   Socioeconomic History   Marital status: Married    Spouse name: Not on file   Number of children: Not on file   Years of education: Not on file   Highest education level: Not on file  Occupational History   Not on file  Tobacco Use   Smoking status: Never   Smokeless tobacco: Never  Substance and Sexual Activity   Alcohol use: Yes    Alcohol/week: 0.0 standard drinks of alcohol    Comment: occ beer    Drug use: No   Sexual activity: Yes  Other Topics Concern   Not on file  Social History Narrative   Not on file   Social Determinants of Health   Financial Resource Strain: Not on file  Food Insecurity: Not on file  Transportation  Needs: Not on file  Physical Activity: Not on file  Stress: Not on file  Social Connections: Not on file  Intimate Partner Violence: Not on file    Review of Systems Per HPI.   Objective:   Vitals:   09/15/22 1330  BP: 134/78  Pulse: 73  Temp: 98.4 F (36.9 C)  SpO2: 95%  Weight: 282 lb 3.2 oz (128 kg)  Height: '6\' 1"'$  (1.854 m)     Physical Exam Constitutional:      General: He is not in acute distress.    Appearance: Normal appearance. He is well-developed.  HENT:     Head: Normocephalic and atraumatic.  Cardiovascular:     Rate and Rhythm: Normal rate.  Pulmonary:     Effort: Pulmonary effort is normal.  Abdominal:     Hernia: A hernia (umbilical, reducible, nontender, no skin changes. prominent midline bulge superior, nontender.) is present.  Neurological:     Mental Status: He is alert and oriented to person, place, and time.  Psychiatric:        Mood and Affect: Mood normal.        Assessment & Plan:  Steven Silva is a 61  y.o. male . Umbilical hernia without obstruction and without gangrene - Plan: Ambulatory referral to General Surgery Easily reducible umbilical hernia without signs of obstruction/gangrene.  Proximal bulge likely diastasis recti versus ventral hernia.  Asymptomatic.  Refer to general surgery for evaluation with hernia precautions given, handout given.  No orders of the defined types were placed in this encounter.  Patient Instructions  I will refer you to surgeon to discuss hernia. See precautions below. Please let me know if there are concerns.   Follow up for physical in next few months.   Hernia, Adult     A hernia is the bulging of an organ or tissue through a weak spot in the muscles of the abdomen. Hernias develop most often near the belly button (navel) or the area where the leg meets the lower abdomen (groin). Common types of hernias include: Incisional hernia. This type bulges through a scar from an abdominal  surgery. Umbilical hernia. This type develops near the navel. Inguinal hernia. This type develops in the groin or scrotum. Femoral hernia. This type develops below the groin, in the upper thigh area. Hiatal hernia. This type occurs when part of the stomach slides above the muscle that separates the abdomen from the chest (diaphragm). What are the causes? This condition may be caused by: Heavy lifting. Coughing over a long period of time. Straining to have a bowel movement. Constipation can lead to straining. An incision made during abdominal surgery. A physical problem that is present at birth (congenital defect). Being overweight or obese. Smoking. Excess fluid in the abdomen. Undescended testicles in males. What are the signs or symptoms? The main symptom is a skin-colored, rounded bulge in the area of the hernia. However, a bulge may not always be present. It may grow bigger or be more visible when you cough or strain (such as when lifting something heavy). A hernia that can be pushed back into the abdomen (is reducible) rarely causes pain. A hernia that cannot be pushed back into the abdomen (is incarcerated) may lose its blood supply (become strangulated). A hernia that is incarcerated may cause: Pain. Fever. Nausea and vomiting. Swelling. Constipation. How is this diagnosed? A hernia may be diagnosed based on: Your symptoms and medical history. A physical exam. Your health care provider may ask you to cough or move in certain ways to see if the hernia becomes visible. Imaging tests, such as: X-rays. Ultrasound. CT scan. How is this treated? A hernia that is small and painless may not need to be treated. A hernia that is large or painful may be treated with surgery. Inguinal hernias may be treated with surgery to prevent incarceration or strangulation. Strangulated hernias are always treated with surgery because the strangulation causes a lack of blood supply to the trapped  organ or tissue. Surgery to treat a hernia involves pushing the bulge back into place and repairing the weak area of the muscle or abdominal wall. Follow these instructions at home: Activity Avoid straining. Do not lift anything that is heavier than 10 lb (4.5 kg), or the limit that you are told, until your health care provider says that it is safe. When lifting heavy objects, lift with your leg muscles, not your back muscles. Preventing constipation Take actions to prevent constipation. Constipation leads to straining with bowel movements, which can make a hernia worse or cause a hernia repair to break down. Your health care provider may recommend that you take these actions to prevent or treat constipation: Drink enough  fluid to keep your urine pale yellow. Take over-the-counter or prescription medicines. Eat foods that are high in fiber, such as beans, whole grains, and fresh fruits and vegetables. Limit foods that are high in fat and processed sugars, such as fried or sweet foods. General instructions When coughing, try to cough gently. You may try to push the hernia back in place by very gently pressing on it while lying down. Do not try to force the bulge back in if it will not push in easily. If you are overweight, work with your health care provider to lose weight safely. Do not use any products that contain nicotine or tobacco. These products include cigarettes, chewing tobacco, and vaping devices, such as e-cigarettes. If you need help quitting, ask your health care provider. If you are scheduled for hernia repair, watch your hernia for any changes in shape, size, or color. Tell your health care provider about any changes or new symptoms. Take over-the-counter and prescription medicines only as told by your health care provider. Keep all follow-up visits. This is important. Contact a health care provider if: You develop new pain, swelling, or redness around your hernia. You have  signs of constipation, such as: Fewer bowel movements in a week than normal. Difficulty having a bowel movement. Stools that are dry, hard, or larger than normal. Get help right away if: You have a fever or chills. You have abdominal pain that gets worse. You feel nauseous or you vomit. You cannot push the hernia back in place by very gently pressing on it while lying down. Do not try to force the bulge back in if it will not go in easily. The hernia: Changes in shape, size, or color. Feels hard or tender. These symptoms may represent a serious problem that is an emergency. Do not wait to see if the symptoms will go away. Get medical help right away. Call your local emergency services (911 in the [place] Do not drive yourself to the hospital. Summary A hernia is the bulging of an organ or tissue through a weak spot in the muscles of the abdomen. The main symptom is a skin-colored bulge in the hernia area. However, a bulge may not always be present. It may grow bigger or more visible when you cough or strain (such as when having a bowel movement). A hernia that is small and painless may not need to be treated. A hernia that is large or painful may be treated with surgery. Surgery to treat a hernia involves pushing the bulge back into place and repairing the weak part of the abdomen. This information is not intended to replace advice given to you by your health care provider. Make sure you discuss any questions you have with your health care provider. Document Revised: 06/11/2020 Document Reviewed: 06/11/2020 Elsevier Patient Education  [place],   Merri Ray, MD [place], [facility] 09/16/22 3:59 PM

## 2022-09-15 NOTE — Patient Instructions (Signed)
I will refer you to surgeon to discuss hernia. See precautions below. Please let me know if there are concerns.   Follow up for physical in next few months.   Hernia, Adult     A hernia is the bulging of an organ or tissue through a weak spot in the muscles of the abdomen. Hernias develop most often near the belly button (navel) or the area where the leg meets the lower abdomen (groin). Common types of hernias include: Incisional hernia. This type bulges through a scar from an abdominal surgery. Umbilical hernia. This type develops near the navel. Inguinal hernia. This type develops in the groin or scrotum. Femoral hernia. This type develops below the groin, in the upper thigh area. Hiatal hernia. This type occurs when part of the stomach slides above the muscle that separates the abdomen from the chest (diaphragm). What are the causes? This condition may be caused by: Heavy lifting. Coughing over a long period of time. Straining to have a bowel movement. Constipation can lead to straining. An incision made during abdominal surgery. A physical problem that is present at birth (congenital defect). Being overweight or obese. Smoking. Excess fluid in the abdomen. Undescended testicles in males. What are the signs or symptoms? The main symptom is a skin-colored, rounded bulge in the area of the hernia. However, a bulge may not always be present. It may grow bigger or be more visible when you cough or strain (such as when lifting something heavy). A hernia that can be pushed back into the abdomen (is reducible) rarely causes pain. A hernia that cannot be pushed back into the abdomen (is incarcerated) may lose its blood supply (become strangulated). A hernia that is incarcerated may cause: Pain. Fever. Nausea and vomiting. Swelling. Constipation. How is this diagnosed? A hernia may be diagnosed based on: Your symptoms and medical history. A physical exam. Your health care provider may  ask you to cough or move in certain ways to see if the hernia becomes visible. Imaging tests, such as: X-rays. Ultrasound. CT scan. How is this treated? A hernia that is small and painless may not need to be treated. A hernia that is large or painful may be treated with surgery. Inguinal hernias may be treated with surgery to prevent incarceration or strangulation. Strangulated hernias are always treated with surgery because the strangulation causes a lack of blood supply to the trapped organ or tissue. Surgery to treat a hernia involves pushing the bulge back into place and repairing the weak area of the muscle or abdominal wall. Follow these instructions at home: Activity Avoid straining. Do not lift anything that is heavier than 10 lb (4.5 kg), or the limit that you are told, until your health care provider says that it is safe. When lifting heavy objects, lift with your leg muscles, not your back muscles. Preventing constipation Take actions to prevent constipation. Constipation leads to straining with bowel movements, which can make a hernia worse or cause a hernia repair to break down. Your health care provider may recommend that you take these actions to prevent or treat constipation: Drink enough fluid to keep your urine pale yellow. Take over-the-counter or prescription medicines. Eat foods that are high in fiber, such as beans, whole grains, and fresh fruits and vegetables. Limit foods that are high in fat and processed sugars, such as fried or sweet foods. General instructions When coughing, try to cough gently. You may try to push the hernia back in place by very gently  pressing on it while lying down. Do not try to force the bulge back in if it will not push in easily. If you are overweight, work with your health care provider to lose weight safely. Do not use any products that contain nicotine or tobacco. These products include cigarettes, chewing tobacco, and vaping devices,  such as e-cigarettes. If you need help quitting, ask your health care provider. If you are scheduled for hernia repair, watch your hernia for any changes in shape, size, or color. Tell your health care provider about any changes or new symptoms. Take over-the-counter and prescription medicines only as told by your health care provider. Keep all follow-up visits. This is important. Contact a health care provider if: You develop new pain, swelling, or redness around your hernia. You have signs of constipation, such as: Fewer bowel movements in a week than normal. Difficulty having a bowel movement. Stools that are dry, hard, or larger than normal. Get help right away if: You have a fever or chills. You have abdominal pain that gets worse. You feel nauseous or you vomit. You cannot push the hernia back in place by very gently pressing on it while lying down. Do not try to force the bulge back in if it will not go in easily. The hernia: Changes in shape, size, or color. Feels hard or tender. These symptoms may represent a serious problem that is an emergency. Do not wait to see if the symptoms will go away. Get medical help right away. Call your local emergency services (911 in the [place] Do not drive yourself to the hospital. Summary A hernia is the bulging of an organ or tissue through a weak spot in the muscles of the abdomen. The main symptom is a skin-colored bulge in the hernia area. However, a bulge may not always be present. It may grow bigger or more visible when you cough or strain (such as when having a bowel movement). A hernia that is small and painless may not need to be treated. A hernia that is large or painful may be treated with surgery. Surgery to treat a hernia involves pushing the bulge back into place and repairing the weak part of the abdomen. This information is not intended to replace advice given to you by your health care provider. Make sure you discuss any questions  you have with your health care provider. Document Revised: 06/11/2020 Document Reviewed: 06/11/2020 Elsevier Patient Education  [place].

## 2022-09-16 ENCOUNTER — Encounter: Payer: Self-pay | Admitting: Family Medicine

## 2022-10-16 DIAGNOSIS — M6200 Separation of muscle (nontraumatic), unspecified site: Secondary | ICD-10-CM | POA: Diagnosis not present

## 2022-10-16 DIAGNOSIS — K420 Umbilical hernia with obstruction, without gangrene: Secondary | ICD-10-CM | POA: Diagnosis not present

## 2022-11-13 NOTE — Progress Notes (Signed)
Surgery orders requested via Epic inbox. °

## 2022-11-18 NOTE — Progress Notes (Signed)
Second request for pre op orders: Spoke with Elmyra Ricks to request orders.

## 2022-11-18 NOTE — Patient Instructions (Signed)
SURGICAL WAITING ROOM VISITATION Patients having surgery or a procedure may have no more than 2 support people in the waiting area - these visitors may rotate.    If the patient needs to stay at the hospital during part of their recovery, the visitor guidelines for inpatient rooms apply. Pre-op nurse will coordinate an appropriate time for 1 support person to accompany patient in pre-op.  This support person may not rotate.    Please refer to the [facility] website for the visitor guidelines for Inpatients (after your surgery is over and you are in a regular room).   Due to an increase in RSV and influenza rates and associated hospitalizations, children ages 86 and under may not visit patients in [place].     Your procedure is scheduled on: 11-27-22   Report to [facility] Main Entrance    Report to admitting at 5:15 AM   Call this number if you have problems the morning of surgery [phone]   Do not eat food :After Midnight.   After Midnight you may have the following liquids until 4:30 AM DAY OF SURGERY  Water Non-Citrus Juices (without pulp, NO RED) Carbonated Beverages Black Coffee (NO MILK/CREAM OR CREAMERS, sugar ok)  Clear Tea (NO MILK/CREAM OR CREAMERS, sugar ok) regular and decaf                             Plain Jell-O (NO RED)                                           Fruit ices (not with fruit pulp, NO RED)                                     Popsicles (NO RED)                                                               Sports drinks like Gatorade (NO RED)                   The day of surgery:  Drink ONE (1) Pre-Surgery Clear Ensure  at 4:30 AM the morning of surgery. Drink in one sitting. Do not sip.  This drink was given to you during your hospital  pre-op appointment visit. Nothing else to drink after completing the Pre-Surgery Clear Ensure .          If you have questions, please contact your surgeon's office.   FOLLOW BOWEL PREP  AND ANY ADDITIONAL PRE OP INSTRUCTIONS YOU RECEIVED FROM YOUR SURGEON'S OFFICE!!!     Oral Hygiene is also important to reduce your risk of infection.                                    Remember - BRUSH YOUR TEETH THE MORNING OF SURGERY WITH YOUR REGULAR TOOTHPASTE   Do NOT smoke after Midnight   Take these medicines the morning of surgery with A SIP  OF WATER:  None  Bring CPAP mask and tubing day of surgery.                              You may not have any metal on your body including jewelry, and body piercing             Do not wear lotions, powders, cologne, or deodorant              Men may shave face and neck.   Do not bring valuables to the hospital. [place].   Contacts, dentures or bridgework may not be worn into surgery.   Bring small overnight bag day of surgery.   DO NOT [place]. PHARMACY WILL DISPENSE MEDICATIONS LISTED ON YOUR MEDICATION LIST TO YOU DURING YOUR ADMISSION [place]!   Special Instructions: Bring a copy of your healthcare power of attorney and living will documents the day of surgery if you haven't scanned them before.              Please read over the following fact sheets you were given: IF [place]  If you received a COVID test during your pre-op visit  it is requested that you wear a mask when out in public, stay away from anyone that may not be feeling well and notify your surgeon if you develop symptoms. If you test positive for Covid or have been in contact with anyone that has tested positive in the last 10 days please notify you surgeon.  [place] - Preparing for Surgery Before surgery, you can play an important role.  Because skin is not sterile, your skin needs to be as free of germs as possible.  You can reduce the number of germs on your skin by washing with CHG (chlorahexidine gluconate)  soap before surgery.  CHG is an antiseptic cleaner which kills germs and bonds with the skin to continue killing germs even after washing. Please DO NOT use if you have an allergy to CHG or antibacterial soaps.  If your skin becomes reddened/irritated stop using the CHG and inform your nurse when you arrive at Short Stay. Do not shave (including legs and underarms) for at least 48 hours prior to the first CHG shower.  You may shave your face/neck.  Please follow these instructions carefully:  1.  Shower with CHG Soap the night before surgery and the  morning of surgery.  2.  If you choose to wash your hair, wash your hair first as usual with your normal  shampoo.  3.  After you shampoo, rinse your hair and body thoroughly to remove the shampoo.                             4.  Use CHG as you would any other liquid soap.  You can apply chg directly to the skin and wash.  Gently with a scrungie or clean washcloth.  5.  Apply the CHG Soap to your body ONLY FROM THE NECK DOWN.   Do   not use on face/ open                           Wound or open sores. Avoid contact with  eyes, ears mouth and   genitals (private parts).                       Wash face,  Genitals (private parts) with your normal soap.             6.  Wash thoroughly, paying special attention to the area where your    surgery  will be performed.  7.  Thoroughly rinse your body with warm water from the neck down.  8.  DO NOT shower/wash with your normal soap after using and rinsing off the CHG Soap.                9.  Pat yourself dry with a clean towel.            10.  Wear clean pajamas.            11.  Place clean sheets on your bed the night of your first shower and do not  sleep with pets. Day of Surgery : Do not apply any lotions/deodorants the morning of surgery.  Please wear clean clothes to the hospital/surgery center.  FAILURE TO FOLLOW THESE INSTRUCTIONS MAY RESULT IN THE CANCELLATION OF YOUR SURGERY  PATIENT  SIGNATURE_________________________________  NURSE SIGNATURE__________________________________  ________________________________________________________________________

## 2022-11-19 ENCOUNTER — Ambulatory Visit: Payer: Self-pay | Admitting: General Surgery

## 2022-11-20 ENCOUNTER — Encounter (HOSPITAL_COMMUNITY): Payer: Self-pay

## 2022-11-20 ENCOUNTER — Encounter (HOSPITAL_COMMUNITY)
Admission: RE | Admit: 2022-11-20 | Discharge: 2022-11-20 | Disposition: A | Discharge status: Home / Self Care | Payer: 59 | Source: Ambulatory Visit | Attending: Anesthesiology | Admitting: Anesthesiology

## 2022-11-20 DIAGNOSIS — Z01818 Encounter for other preprocedural examination: Secondary | ICD-10-CM

## 2022-12-02 NOTE — Progress Notes (Addendum)
PCP - Merri Ray, MD [facility] 09-15-22 epic Cardiologist - no  PPM/ICD -  Device Orders -  Rep Notified -   Chest x-ray -  EKG -  Stress Test -  ECHO -  Cardiac Cath -   Sleep Study -  CPAP -   Fasting Blood Sugar -  Checks Blood Sugar _____ times a day  Blood Thinner Instructions: Aspirin Instructions:  ERAS Protcol - PRE-SURGERY Ensure   COVID TEST-  COVID vaccine -  Activity--Able to climb a flight of stairs without SOB Anesthesia review: OSA  Patient denies shortness of breath, fever, cough and chest pain at PAT appointment   All instructions explained to the patient, with a verbal understanding of the material. Patient agrees to go over the instructions while at home for a better understanding. Patient also instructed to self quarantine after being tested for COVID-19. The opportunity to ask questions was provided.

## 2022-12-02 NOTE — Patient Instructions (Addendum)
SURGICAL WAITING ROOM VISITATION Patients having surgery or a procedure may have no more than 2 support people in the waiting area - these visitors may rotate.    If the patient needs to stay at the hospital during part of their recovery, the visitor guidelines for inpatient rooms apply. Pre-op nurse will coordinate an appropriate time for 1 support person to accompany patient in pre-op.  This support person may not rotate.    Please refer to the [facility] website for the visitor guidelines for Inpatients (after your surgery is over and you are in a regular room).   Due to an increase in RSV and influenza rates and associated hospitalizations, children ages 54 and under may not visit patients in [place].     Your procedure is scheduled on: 12-11-22   Report to [facility] Main Entrance    Report to admitting at    1130  AM   Call this number if you have problems the morning of surgery [phone]   Do not eat food :After Midnight.   After Midnight you may have the following liquids until 1050 AM DAY OF SURGERY  Water Non-Citrus Juices (without pulp, NO RED) Carbonated Beverages Black Coffee (NO MILK/CREAM OR CREAMERS, sugar ok)  Clear Tea (NO MILK/CREAM OR CREAMERS, sugar ok) regular and decaf                             Plain Jell-O (NO RED)                                           Fruit ices (not with fruit pulp, NO RED)                                     Popsicles (NO RED)                                                               Sports drinks like Gatorade (NO RED)                   The day of surgery:  Drink ONE (1) Pre-Surgery Clear Ensure  at 1050 AM the morning of surgery. Drink in one sitting. Do not sip.  This drink was given to you during your hospital  pre-op appointment visit. Nothing else to drink after completing the Pre-Surgery Clear Ensure .          If you have questions, please contact your surgeon's office.   FOLLOW BOWEL  PREP AND ANY ADDITIONAL PRE OP INSTRUCTIONS YOU RECEIVED FROM YOUR SURGEON'S OFFICE!!!     Oral Hygiene is also important to reduce your risk of infection.                                    Remember - BRUSH YOUR TEETH THE MORNING OF SURGERY WITH YOUR REGULAR TOOTHPASTE   Do NOT smoke after Midnight   Take these medicines the morning of  surgery with A SIP OF WATER:  None  Bring CPAP mask and tubing day of surgery.                              You may not have any metal on your body including jewelry, and body piercing             Do not wear lotions, powders, cologne, or deodorant              Men may shave face and neck.   Do not bring valuables to the hospital. [place].   Contacts, dentures or bridgework may not be worn into surgery.   Bring small overnight bag day of surgery.   DO NOT [place]. PHARMACY WILL DISPENSE MEDICATIONS LISTED ON YOUR MEDICATION LIST TO YOU DURING YOUR ADMISSION [place]!                Please read over the following fact sheets you were given: IF [place]  If you received a COVID test during your pre-op visit  it is requested that you wear a mask when out in public, stay away from anyone that may not be feeling well and notify your surgeon if you develop symptoms. If you test positive for Covid or have been in contact with anyone that has tested positive in the last 10 days please notify you surgeon.   - Preparing for Surgery Before surgery, you can play an important role.  Because skin is not sterile, your skin needs to be as free of germs as possible.  You can reduce the number of germs on your skin by washing with CHG (chlorahexidine gluconate) soap before surgery.  CHG is an antiseptic cleaner which kills germs and bonds with the skin to continue killing germs even after washing. Please DO  NOT use if you have an allergy to CHG or antibacterial soaps.  If your skin becomes reddened/irritated stop using the CHG and inform your nurse when you arrive at Short Stay. Do not shave (including legs and underarms) for at least 48 hours prior to the first CHG shower.  You may shave your face/neck.  Please follow these instructions carefully:  1.  Shower with CHG Soap the night before surgery and the  morning of surgery.  2.  If you choose to wash your hair, wash your hair first as usual with your normal  shampoo.  3.  After you shampoo, rinse your hair and body thoroughly to remove the shampoo.                             4.  Use CHG as you would any other liquid soap.  You can apply chg directly to the skin and wash.  Gently with a scrungie or clean washcloth.  5.  Apply the CHG Soap to your body ONLY FROM THE NECK DOWN.   Do   not use on face/ open                           Wound or open sores. Avoid contact with eyes, ears mouth and   genitals (private parts).  Wash face,  Genitals (private parts) with your normal soap.             6.  Wash thoroughly, paying special attention to the area where your    surgery  will be performed.  7.  Thoroughly rinse your body with warm water from the neck down.  8.  DO NOT shower/wash with your normal soap after using and rinsing off the CHG Soap.                9.  Pat yourself dry with a clean towel.            10.  Wear clean pajamas.            11.  Place clean sheets on your bed the night of your first shower and do not  sleep with pets. Day of Surgery : Do not apply any lotions/deodorants the morning of surgery.  Please wear clean clothes to the hospital/surgery center.  FAILURE TO FOLLOW THESE INSTRUCTIONS MAY RESULT IN THE CANCELLATION OF YOUR SURGERY  PATIENT SIGNATURE_________________________________  NURSE  SIGNATURE__________________________________  ________________________________________________________________________

## 2022-12-05 ENCOUNTER — Other Ambulatory Visit: Payer: Self-pay

## 2022-12-05 ENCOUNTER — Encounter (HOSPITAL_COMMUNITY)
Admission: RE | Admit: 2022-12-05 | Discharge: 2022-12-05 | Disposition: A | Discharge status: Home / Self Care | Payer: 59 | Source: Ambulatory Visit | Attending: General Surgery | Admitting: General Surgery

## 2022-12-05 ENCOUNTER — Encounter (HOSPITAL_COMMUNITY): Payer: Self-pay

## 2022-12-05 DIAGNOSIS — Z01812 Encounter for preprocedural laboratory examination: Secondary | ICD-10-CM | POA: Diagnosis not present

## 2022-12-05 DIAGNOSIS — Z01818 Encounter for other preprocedural examination: Secondary | ICD-10-CM

## 2022-12-05 LAB — CBC
HCT: 45.5 % (ref 39.0–52.0)
Hemoglobin: 15.1 g/dL (ref 13.0–17.0)
MCH: 30.1 pg (ref 26.0–34.0)
MCHC: 33.2 g/dL (ref 30.0–36.0)
MCV: 90.8 fL (ref 80.0–100.0)
Platelets: 248 10*3/uL (ref 150–400)
RBC: 5.01 MIL/uL (ref 4.22–5.81)
RDW: 12.8 % (ref 11.5–15.5)
WBC: 6.8 10*3/uL (ref 4.0–10.5)
nRBC: 0 % (ref 0.0–0.2)

## 2022-12-10 ENCOUNTER — Encounter (HOSPITAL_COMMUNITY): Payer: Self-pay | Admitting: General Surgery

## 2022-12-11 ENCOUNTER — Encounter (HOSPITAL_COMMUNITY)
Admission: RE | Disposition: A | Discharge status: Home / Self Care | Payer: Self-pay | Source: Ambulatory Visit | Attending: General Surgery

## 2022-12-11 ENCOUNTER — Ambulatory Visit (HOSPITAL_BASED_OUTPATIENT_CLINIC_OR_DEPARTMENT_OTHER): Payer: 59 | Admitting: Anesthesiology

## 2022-12-11 ENCOUNTER — Other Ambulatory Visit: Payer: Self-pay

## 2022-12-11 ENCOUNTER — Ambulatory Visit (HOSPITAL_COMMUNITY)
Admission: RE | Admit: 2022-12-11 | Discharge: 2022-12-11 | Disposition: A | Discharge status: Home / Self Care | Payer: 59 | Source: Ambulatory Visit | Attending: General Surgery | Admitting: General Surgery

## 2022-12-11 ENCOUNTER — Encounter (HOSPITAL_COMMUNITY): Payer: Self-pay | Admitting: General Surgery

## 2022-12-11 ENCOUNTER — Ambulatory Visit (HOSPITAL_COMMUNITY): Payer: 59 | Admitting: Anesthesiology

## 2022-12-11 DIAGNOSIS — G4730 Sleep apnea, unspecified: Secondary | ICD-10-CM | POA: Insufficient documentation

## 2022-12-11 DIAGNOSIS — K420 Umbilical hernia with obstruction, without gangrene: Secondary | ICD-10-CM

## 2022-12-11 HISTORY — PX: UMBILICAL HERNIA REPAIR: SHX196

## 2022-12-11 SURGERY — REPAIR, HERNIA, UMBILICAL, LAPAROSCOPIC
Anesthesia: General

## 2022-12-11 MED ORDER — PHENYLEPHRINE 80 MCG/ML (10ML) SYRINGE FOR IV PUSH (FOR BLOOD PRESSURE SUPPORT)
PREFILLED_SYRINGE | INTRAVENOUS | Status: AC
  Filled 2022-12-11: qty 10

## 2022-12-11 MED ORDER — BUPIVACAINE HCL 0.25 % IJ SOLN
INTRAMUSCULAR | Status: AC
  Filled 2022-12-11: qty 1

## 2022-12-11 MED ORDER — 0.9 % SODIUM CHLORIDE (POUR BTL) OPTIME
TOPICAL | Status: DC | PRN
  Administered 2022-12-11: 1000 mL

## 2022-12-11 MED ORDER — LIDOCAINE HCL (PF) 2 % IJ SOLN
INTRAMUSCULAR | Status: AC
  Filled 2022-12-11: qty 5

## 2022-12-11 MED ORDER — KETOROLAC TROMETHAMINE 30 MG/ML IJ SOLN
INTRAMUSCULAR | Status: DC | PRN
  Administered 2022-12-11: 30 mg via INTRAVENOUS

## 2022-12-11 MED ORDER — LACTATED RINGERS IR SOLN
Status: DC | PRN
  Administered 2022-12-11: 10 mL

## 2022-12-11 MED ORDER — KETAMINE HCL 10 MG/ML IJ SOLN
INTRAMUSCULAR | Status: AC
  Filled 2022-12-11: qty 1

## 2022-12-11 MED ORDER — OXYCODONE HCL 5 MG PO TABS: 5.0000 mg | ORAL_TABLET | Freq: Four times a day (QID) | ORAL | 0 refills | Status: DC | PRN

## 2022-12-11 MED ORDER — LACTATED RINGERS IV SOLN: INTRAVENOUS | Status: DC

## 2022-12-11 MED ORDER — ACETAMINOPHEN 500 MG PO TABS
1000.0000 mg | ORAL_TABLET | Freq: Once | ORAL | Status: AC
  Administered 2022-12-11: 1000 mg via ORAL
  Filled 2022-12-11: qty 2

## 2022-12-11 MED ORDER — PROMETHAZINE HCL 25 MG/ML IJ SOLN: 6.2500 mg | INTRAMUSCULAR | Status: DC | PRN

## 2022-12-11 MED ORDER — LIDOCAINE HCL (PF) 2 % IJ SOLN
INTRAMUSCULAR | Status: AC
  Filled 2022-12-11: qty 10

## 2022-12-11 MED ORDER — CEFAZOLIN SODIUM-DEXTROSE 2-4 GM/100ML-% IV SOLN
2.0000 g | INTRAVENOUS | Status: AC
  Administered 2022-12-11: 3 g via INTRAVENOUS
  Filled 2022-12-11: qty 100

## 2022-12-11 MED ORDER — CHLORHEXIDINE GLUCONATE 0.12 % MT SOLN
15.0000 mL | Freq: Once | OROMUCOSAL | Status: AC
  Administered 2022-12-11: 15 mL via OROMUCOSAL

## 2022-12-11 MED ORDER — KETAMINE HCL 10 MG/ML IJ SOLN
INTRAMUSCULAR | Status: DC | PRN
  Administered 2022-12-11: 20 mg via INTRAVENOUS
  Administered 2022-12-11: 30 mg via INTRAVENOUS

## 2022-12-11 MED ORDER — ENSURE PRE-SURGERY PO LIQD: 296.0000 mL | Freq: Once | ORAL | Status: DC

## 2022-12-11 MED ORDER — SUCCINYLCHOLINE CHLORIDE 200 MG/10ML IV SOSY
PREFILLED_SYRINGE | INTRAVENOUS | Status: AC
  Filled 2022-12-11: qty 10

## 2022-12-11 MED ORDER — SUGAMMADEX SODIUM 500 MG/5ML IV SOLN
INTRAVENOUS | Status: AC
  Filled 2022-12-11: qty 5

## 2022-12-11 MED ORDER — ACETAMINOPHEN 500 MG PO TABS: 1000.0000 mg | ORAL_TABLET | ORAL | Status: DC

## 2022-12-11 MED ORDER — ACETAMINOPHEN 500 MG PO TABS: 1000.0000 mg | ORAL_TABLET | Freq: Three times a day (TID) | ORAL | 0 refills | Status: AC

## 2022-12-11 MED ORDER — FENTANYL CITRATE (PF) 250 MCG/5ML IJ SOLN
INTRAMUSCULAR | Status: AC
  Filled 2022-12-11: qty 5

## 2022-12-11 MED ORDER — PHENYLEPHRINE 80 MCG/ML (10ML) SYRINGE FOR IV PUSH (FOR BLOOD PRESSURE SUPPORT)
PREFILLED_SYRINGE | INTRAVENOUS | Status: DC | PRN
  Administered 2022-12-11 (×2): 160 ug via INTRAVENOUS

## 2022-12-11 MED ORDER — CHLORHEXIDINE GLUCONATE CLOTH 2 % EX PADS: 6.0000 | MEDICATED_PAD | Freq: Once | CUTANEOUS | Status: DC

## 2022-12-11 MED ORDER — CEFAZOLIN SODIUM 1 G IJ SOLR
INTRAMUSCULAR | Status: AC
  Filled 2022-12-11: qty 10

## 2022-12-11 MED ORDER — FENTANYL CITRATE PF 50 MCG/ML IJ SOSY: 25.0000 ug | PREFILLED_SYRINGE | INTRAMUSCULAR | Status: DC | PRN

## 2022-12-11 MED ORDER — BUPIVACAINE HCL 0.25 % IJ SOLN
INTRAMUSCULAR | Status: DC | PRN
  Administered 2022-12-11: 30 mL

## 2022-12-11 MED ORDER — PROPOFOL 10 MG/ML IV BOLUS
INTRAVENOUS | Status: AC
  Filled 2022-12-11: qty 20

## 2022-12-11 MED ORDER — PROPOFOL 10 MG/ML IV BOLUS
INTRAVENOUS | Status: DC | PRN
  Administered 2022-12-11: 150 mg via INTRAVENOUS

## 2022-12-11 MED ORDER — DEXAMETHASONE SODIUM PHOSPHATE 10 MG/ML IJ SOLN
INTRAMUSCULAR | Status: AC
  Filled 2022-12-11: qty 1

## 2022-12-11 MED ORDER — ORAL CARE MOUTH RINSE: 15.0000 mL | Freq: Once | OROMUCOSAL | Status: AC

## 2022-12-11 MED ORDER — FENTANYL CITRATE (PF) 100 MCG/2ML IJ SOLN
INTRAMUSCULAR | Status: DC | PRN
  Administered 2022-12-11: 50 ug via INTRAVENOUS
  Administered 2022-12-11: 100 ug via INTRAVENOUS
  Administered 2022-12-11: 50 ug via INTRAVENOUS

## 2022-12-11 MED ORDER — SUGAMMADEX SODIUM 500 MG/5ML IV SOLN
INTRAVENOUS | Status: DC | PRN
  Administered 2022-12-11: 300 mg via INTRAVENOUS

## 2022-12-11 MED ORDER — ONDANSETRON HCL 4 MG/2ML IJ SOLN
INTRAMUSCULAR | Status: DC | PRN
  Administered 2022-12-11: 4 mg via INTRAVENOUS

## 2022-12-11 MED ORDER — SUCCINYLCHOLINE CHLORIDE 200 MG/10ML IV SOSY
PREFILLED_SYRINGE | INTRAVENOUS | Status: DC | PRN
  Administered 2022-12-11: 140 mg via INTRAVENOUS

## 2022-12-11 MED ORDER — BUPIVACAINE LIPOSOME 1.3 % IJ SUSP
INTRAMUSCULAR | Status: DC | PRN
  Administered 2022-12-11: 20 mL

## 2022-12-11 MED ORDER — MIDAZOLAM HCL 2 MG/2ML IJ SOLN
INTRAMUSCULAR | Status: AC
  Filled 2022-12-11: qty 2

## 2022-12-11 MED ORDER — BUPIVACAINE LIPOSOME 1.3 % IJ SUSP
INTRAMUSCULAR | Status: AC
  Filled 2022-12-11: qty 20

## 2022-12-11 MED ORDER — LIDOCAINE 2% (20 MG/ML) 5 ML SYRINGE
INTRAMUSCULAR | Status: DC | PRN
  Administered 2022-12-11: 1.5 mg/kg/h via INTRAVENOUS
  Administered 2022-12-11: 100 mg via INTRAVENOUS

## 2022-12-11 MED ORDER — MIDAZOLAM HCL 5 MG/5ML IJ SOLN
INTRAMUSCULAR | Status: DC | PRN
  Administered 2022-12-11: 2 mg via INTRAVENOUS

## 2022-12-11 MED ORDER — DEXAMETHASONE SODIUM PHOSPHATE 10 MG/ML IJ SOLN
INTRAMUSCULAR | Status: DC | PRN
  Administered 2022-12-11: 10 mg via INTRAVENOUS

## 2022-12-11 MED ORDER — SCOPOLAMINE 1 MG/3DAYS TD PT72
1.0000 | MEDICATED_PATCH | TRANSDERMAL | Status: DC
  Administered 2022-12-11: 1.5 mg via TRANSDERMAL
  Filled 2022-12-11: qty 1

## 2022-12-11 MED ORDER — ONDANSETRON HCL 4 MG/2ML IJ SOLN
INTRAMUSCULAR | Status: AC
  Filled 2022-12-11: qty 2

## 2022-12-11 MED ORDER — AMISULPRIDE (ANTIEMETIC) 5 MG/2ML IV SOLN: 10.0000 mg | Freq: Once | INTRAVENOUS | Status: DC | PRN

## 2022-12-11 MED ORDER — ROCURONIUM BROMIDE 10 MG/ML (PF) SYRINGE
PREFILLED_SYRINGE | INTRAVENOUS | Status: AC
  Filled 2022-12-11: qty 10

## 2022-12-11 MED ORDER — ROCURONIUM BROMIDE 10 MG/ML (PF) SYRINGE
PREFILLED_SYRINGE | INTRAVENOUS | Status: DC | PRN
  Administered 2022-12-11: 20 mg via INTRAVENOUS
  Administered 2022-12-11: 40 mg via INTRAVENOUS

## 2022-12-11 SURGICAL SUPPLY — 38 items
ADH SKN CLS APL DERMABOND .7 (GAUZE/BANDAGES/DRESSINGS) ×1
APL PRP STRL LF DISP 70% ISPRP (MISCELLANEOUS) ×1
BAG COUNTER SPONGE SURGICOUNT (BAG) IMPLANT
BAG SPNG CNTER NS LX DISP (BAG)
BINDER ABDOMINAL 12 ML 46-62 (SOFTGOODS) IMPLANT
CABLE HIGH FREQUENCY MONO STRZ (ELECTRODE) IMPLANT
CHLORAPREP W/TINT 26 (MISCELLANEOUS) ×2 IMPLANT
COVER SURGICAL LIGHT HANDLE (MISCELLANEOUS) ×2 IMPLANT
DERMABOND ADVANCED .7 DNX12 (GAUZE/BANDAGES/DRESSINGS) IMPLANT
DEVICE SECURE STRAP 25 ABSORB (INSTRUMENTS) IMPLANT
DEVICE TROCAR PUNCTURE CLOSURE (ENDOMECHANICALS) IMPLANT
DRAPE INCISE IOBAN 66X45 STRL (DRAPES) IMPLANT
ELECT REM PT RETURN 15FT ADLT (MISCELLANEOUS) ×2 IMPLANT
GLOVE BIO SURGEON STRL SZ7.5 (GLOVE) ×2 IMPLANT
GLOVE INDICATOR 8.0 STRL GRN (GLOVE) ×2 IMPLANT
GOWN STRL REUS W/ TWL XL LVL3 (GOWN DISPOSABLE) ×6 IMPLANT
GOWN STRL REUS W/TWL XL LVL3 (GOWN DISPOSABLE) ×3
IRRIG SUCT STRYKERFLOW 2 WTIP (MISCELLANEOUS) ×1
IRRIGATION SUCT STRKRFLW 2 WTP (MISCELLANEOUS) IMPLANT
KIT BASIN OR (CUSTOM PROCEDURE TRAY) ×2 IMPLANT
KIT TURNOVER KIT A (KITS) IMPLANT
MARKER SKIN DUAL TIP RULER LAB (MISCELLANEOUS) ×2 IMPLANT
MESH VENTRALIGHT ST 6IN CRC (Mesh General) IMPLANT
NDL SPNL 22GX3.5 QUINCKE BK (NEEDLE) IMPLANT
NEEDLE SPNL 22GX3.5 QUINCKE BK (NEEDLE) ×1 IMPLANT
PENCIL SMOKE EVACUATOR (MISCELLANEOUS) IMPLANT
SCISSORS LAP 5X35 DISP (ENDOMECHANICALS) ×2 IMPLANT
SET TUBE SMOKE EVAC HIGH FLOW (TUBING) ×2 IMPLANT
SHEARS HARMONIC ACE PLUS 36CM (ENDOMECHANICALS) IMPLANT
SLEEVE Z-THREAD 5X100MM (TROCAR) ×2 IMPLANT
SPIKE FLUID TRANSFER (MISCELLANEOUS) ×2 IMPLANT
SUT MNCRL AB 4-0 PS2 18 (SUTURE) ×2 IMPLANT
SUT NOVA 1 T20/GS 25DT (SUTURE) ×4 IMPLANT
SUT VIC AB 3-0 SH 18 (SUTURE) ×2 IMPLANT
TOWEL OR 17X26 10 PK STRL BLUE (TOWEL DISPOSABLE) ×2 IMPLANT
TRAY LAPAROSCOPIC (CUSTOM PROCEDURE TRAY) ×2 IMPLANT
TROCAR 11X100 Z THREAD (TROCAR) ×2 IMPLANT
TROCAR Z-THREAD OPTICAL 5X100M (TROCAR) ×2 IMPLANT

## 2022-12-11 NOTE — Anesthesia Postprocedure Evaluation (Signed)
Anesthesia Post Note  Patient: Steven Silva  Procedure(s) Performed: LAPAROSCOPIC ASSISTED INCARCERATED UMBILICAL HERNIA REPAIR WITH MESH     Patient location during evaluation: PACU Anesthesia Type: General Level of consciousness: awake and alert Pain management: pain level controlled Vital Signs Assessment: post-procedure vital signs reviewed and stable Respiratory status: spontaneous breathing, nonlabored ventilation, respiratory function stable and patient connected to nasal cannula oxygen Cardiovascular status: blood pressure returned to baseline and stable Postop Assessment: no apparent nausea or vomiting Anesthetic complications: no  No notable events documented.  Last Vitals:  Vitals:   12/11/22 1645 12/11/22 1700  BP: (!) 142/93 (!) 142/88  Pulse: 67 64  Resp:    Temp:    SpO2: 95% 94%    Last Pain:  Vitals:   12/11/22 1645  TempSrc:   PainSc: 3                  Laurelle Skiver S

## 2022-12-11 NOTE — H&P (Signed)
CC: umbilical hernia  Requesting provider: n/a  HPI: Steven Silva is an 62 y.o. male who is here for laparoscopic assisted repair of incarcerated umbilical hernia with mesh.  He denies any changes since I saw him in the clinic in November.  Old hpi: Willliam Pettet is a 62 y.o. male who is seen today as an office consultation at the request of Dr. Carlota Raspberry for evaluation of New Consultation and Umbilical Hernia .   He states that he has had a bulge at his umbilicus for several years. It causes some discomfort if he pushes on the area. No nausea or vomiting. No overt constipation. He does have some occasional loose stools. No prior abdominal surgery. Had a remote history of spinal fusion which is affected his bowel habits to a degree as well as his mobility. He plans to have spinal surgery again next summer. No blood clots. No chest pain, chest pressure, shortness of breath.   Past Medical History:  Diagnosis Date   Neuromuscular disorder ([place])    neuropathy from spinal surgery    Sleep apnea    wears cpap , no 02    Past Surgical History:  Procedure Laterality Date   COLONOSCOPY     INCISION AND DRAINAGE ABSCESS Left 01/12/2016   Procedure: INCISION AND DRAINAGE ABSCESS buttock abscess;  Surgeon: [name], MD;  Location: [facility] ORS;  Service: General;  Laterality: Left;   KNEE ARTHROSCOPY Bilateral    x 5 total- 3 1 knee, 2 other - pt unsure which    SPINE SURGERY     ACDF C3-C7    Family History  Problem Relation Age of Onset   Healthy Mother    Healthy Father    Healthy Sister    Healthy Brother    Colon cancer Neg Hx    Colon polyps Neg Hx    Esophageal cancer Neg Hx    Rectal cancer Neg Hx    Stomach cancer Neg Hx     Social:  reports that he has never smoked. He has never used smokeless tobacco. He reports current alcohol use. He reports that he does not use drugs.  Allergies: No Known Allergies  Medications: I have reviewed the patient's current  medications.   ROS - all of the below systems have been reviewed with the patient and positives are indicated with bold text General: chills, fever or night sweats Eyes: blurry vision or double vision ENT: epistaxis or sore throat Allergy/Immunology: itchy/watery eyes or nasal congestion Hematologic/Lymphatic: bleeding problems, blood clots or swollen lymph nodes Endocrine: temperature intolerance or unexpected weight changes Breast: new or changing breast lumps or nipple discharge Resp: cough, shortness of breath, or wheezing CV: chest pain or dyspnea on exertion GI: as per HPI GU: dysuria, trouble voiding, or hematuria MSK: joint pain or joint stiffness Neuro: TIA or stroke symptoms Derm: pruritus and skin lesion changes Psych: anxiety and depression  PE Height '6\' 1"'$  (1.854 m), weight 126.3 kg. Constitutional: NAD; conversant; no deformities Eyes: Moist conjunctiva; no lid lag; anicteric; PERRL Neck: Trachea midline; no thyromegaly Lungs: Normal respiratory effort; no tactile fremitus CV: RRR; no palpable thrills; no pitting edema GI: bese, soft, very mild upper midline diastasis, incarcerated umbilical hernia - soft, fat containing, defect about 3 cm; no palpable hepatosplenomegaly  MSK: Normal gait; no clubbing/cyanosis Psychiatric: Appropriate affect; alert and oriented x3 Lymphatic: No palpable cervical or axillary lymphadenopathy Skin:no rash/lesions/jaundice  No results found for this or any previous visit (from the past 65  hour(s)).  No results found.  Imaging:   A/P: BRAYDYN SCHULTES is an 62 y.o. male with  Incarcerated umbilical hernia  Diastasis of muscle   2 OR for laparoscopic assisted repair of incarcerated umbilical hernia with mesh IV antibiotic Enhanced recovery protocol All questions asked and answered Leighton Ruff. Redmond Pulling, MD, FACS General, Bariatric, & Minimally Invasive Surgery [place]

## 2022-12-11 NOTE — Op Note (Signed)
Steven Silva [id number] [birth year] 12/11/2022   Laparoscopic Assisted Repair of Incarcerated Umbilical Hernia with Mesh with laparoscopic bilateral tap block  Procedure Note  Indications: Symptomatic incarcerated umbilical hernia  Pre-operative Diagnosis: incarcerated umbilical hernia  Post-operative Diagnosis: incarcerated umbilical hernia (incarcerated with omentum)  Surgeon: Greer Pickerel MD FACS  Assistants: none  Anesthesia: General endotracheal anesthesia   Procedure Details  The patient was seen in the Holding Room. The risks, benefits, complications, treatment options, and expected outcomes were discussed with the patient. The possibilities of reaction to medication, pulmonary aspiration, perforation of viscus, bleeding, recurrent infection, the need for additional procedures, failure to diagnose a condition, and creating a complication requiring transfusion or operation were discussed with the patient. The patient concurred with the proposed plan, giving informed consent.  The site of surgery properly noted/marked. The patient was taken to the operating room, identified as Steven Silva  and the procedure verified as laparoscopic assisted repair of incarcerated umbilical hernia with mesh. A Time Out was held and the above information confirmed.    The patient was placed supine.  Patient voided prior to going to the operating room.  After establishing general anesthesia, Left arm tucked with the appropriate padding.  The abdomen was prepped with Chloraprep and draped in standard fashion.  A 5 mm Optiview was used the cannulate the peritoneal cavity in the left upper quadrant below the costal margin.  Pneumoperitoneum was obtained by insufflating CO2, maintaining a maximum pressure of 15 mmHg.  The 5 mm 30-degree laparoscopic was inserted.  There were no significant omental adhesions to the anterior abdominal wall in and around the hernia defect.   Another 5-mm port was placed in  the left lateral abdominal wall.  There was a plug of omentum incarcerated into the umbilical hernia.  The defect measured 2 cm x 2-1/2 cm.  Using traction with an atraumatic grasper I was able to partially reduce the incarcerated plug of omentum.  I used harmonic scalpel to free the incarcerated omentum from the hernia defect.   We visualized 1 fascial defect.  I took down a small portion of the falciform ligament.  I then proceeded with the open portion of the procedure.  A curvilinear infraumbilical incision was created. Dissection was carried down to the hernia sac located above the fascia and was mobilized from surrounding structures. Intact fascia was identified circumferentially around the defect.  The hernia sac was excised and discarded.  Skin and soft tissue was mobilized from the surface of the fascia in a circumferential manner. A finger sweep was performed underneath the fascia to ensure there were no adhesions to the anterior abdominal wall.  I then measured the defect.  It measured 2.5 cm x 2 cm.  With adding a 5 cm overlap circumferentially this gave [place] a mesh requirement of a round 12 cm.  Therefore I selected a round piece of Bard ventral light ST mesh measuring 15.2 cm We placed 3 stay sutures of 1-0 Novofil in the mesh-1 at the superior edge (12 o'clock), 1 in the central portion of the mesh and 1 at the inferior portion (6 oclock).  The mesh was then rolled up and inserted through the fascial defect. The fascia was then closed primarily over the mesh with 4 interrupted 1-0-novafil sutures.   I then returned laparoscopically.  I then infiltrated Exparel in a regional fashion in the preperitoneal space around the umbilical fascial closure and around the expected locations of where the transfascial sutures would be  placed along the lateral abdominal walls as a tap block was offered pain relief..  The mesh was then unrolled.  The stay sutures were then pulled up through small stab incisions  using the Endo-close device.  This deployed the mesh widely over the fascial defect.  The stay sutures were then tied down.  The Secure Strap device was then used to tack down the edges of the mesh at 1 cm intervals circumferentially. We placed a few tacks inside the outer ring of tacks.  We inspected for hemostasis.   Pneumoperitoneum was then released as we removed the remainder of the trocars.   The umbilical cavity was irrigated. The umbilical stalk was then tacked back down to the fascia with a 3-0 vicryl suture.  The port sites and umbilical incision were closed with 4-0 Monocryl.  All of the incisions and stay suture sites were then covered dermabond.  An abdominal binder was placed around the patient's abdomen.  The patient was extubated and brought to the recovery room in stable condition.  All sponge, instrument, and needle counts were correct prior to closure and at the conclusion of the case.   Findings: Type of repair - primary suture with mesh underlay Name of mesh - Bard VentralightST  Size of mesh - Round 6 in (15.2cm)  Mesh overlap - >5 cm  Placement of mesh - beneath fascia and into peritoneal cavity,  Incarcerated omentum  Defect measured 2.5 x 2cm prior to reduction  Estimated Blood Loss:  Minimal         Complications:  None; patient tolerated the procedure well.         Disposition: PACU - hemodynamically stable.         Condition: stable  Leighton Ruff. Redmond Pulling, MD, FACS General, Bariatric, & Minimally Invasive Surgery [facility] Surgery,  [place]

## 2022-12-11 NOTE — Transfer of Care (Signed)
Immediate Anesthesia Transfer of Care Note  Patient: Steven Silva  Procedure(s) Performed: LAPAROSCOPIC ASSISTED INCARCERATED UMBILICAL HERNIA REPAIR WITH MESH  Patient Location: PACU  Anesthesia Type:General  Level of Consciousness: awake, alert , and oriented  Airway & Oxygen Therapy: Patient Spontanous Breathing and Patient connected to face mask oxygen  Post-op Assessment: Report given to RN and Post -op Vital signs reviewed and stable  Post vital signs: Reviewed and stable  Last Vitals:  Vitals Value Taken Time  BP 157/104 12/11/22 1547  Temp    Pulse 59 12/11/22 1550  Resp 13 12/11/22 1550  SpO2 100 % 12/11/22 1550  Vitals shown include unvalidated device data.  Last Pain:  Vitals:   12/11/22 1156  TempSrc: (P) Oral         Complications: No notable events documented.

## 2022-12-11 NOTE — Discharge Instructions (Signed)
[facility]: POST OP INSTRUCTIONS Always review your discharge instruction sheet given to you by the facility where your surgery was performed. IF YOU HAVE DISABILITY OR FAMILY LEAVE FORMS, YOU MUST BRING THEM TO THE OFFICE FOR PROCESSING.   DO NOT GIVE THEM TO YOUR DOCTOR.  PAIN CONTROL  First take acetaminophen (Tylenol) AND/or ibuprofen (Advil) to control your pain after surgery.  Follow directions on package.  Taking acetaminophen (Tylenol) and/or ibuprofen (Advil) regularly after surgery will help to control your pain and lower the amount of prescription pain medication you may need.  You should not take more than 3,000 mg (3 grams) of acetaminophen (Tylenol) in 24 hours.  You should not take ibuprofen (Advil), aleve, motrin, naprosyn or other NSAIDS if you have a history of stomach ulcers or chronic kidney disease.  A prescription for pain medication may be given to you upon discharge.  Take your pain medication as prescribed, if you still have uncontrolled pain after taking acetaminophen (Tylenol) or ibuprofen (Advil). Use ice packs to help control pain. If you need a refill on your pain medication, please contact your pharmacy.  They will contact our office to request authorization. Prescriptions will not be filled after 5pm or on week-ends.  HOME MEDICATIONS Take your usually prescribed medications unless otherwise directed.  DIET You should follow a light diet the first few days after arrival home.  Be sure to include lots of fluids daily. Avoid fatty, fried foods.   CONSTIPATION It is common to experience some constipation after surgery and if you are taking pain medication.  Increasing fluid intake and taking a stool softener (such as Colace) will usually help or prevent this problem from occurring.  A mild laxative (Milk of Magnesia or Miralax) should be taken according to package instructions if there are no bowel movements after 48  hours.  WOUND/INCISION CARE Most patients will experience some swelling and bruising in the area of the incisions.  Ice packs will help.  Swelling and bruising can take several days to resolve.  Unless discharge instructions indicate otherwise, follow guidelines below  STERI-STRIPS - you may remove your outer bandages 48 hours after surgery, and you may shower at that time.  You have steri-strips (small skin tapes) in place directly over the incision.  These strips should be left on the skin for 7-10 days.   DERMABOND/SKIN GLUE - you may shower in 24 hours.  The glue will flake off over the next 2-3 weeks. Any sutures or staples will be removed at the office during your follow-up visit.  ACTIVITIES You may resume regular (light) daily activities beginning the next day--such as daily self-care, walking, climbing stairs--gradually increasing activities as tolerated.  You may have sexual intercourse when it is comfortable.  Refrain from any heavy lifting or straining until approved by your doctor. You may drive when you are no longer taking prescription pain medication, you can comfortably wear a seatbelt, and you can safely maneuver your car and apply brakes.  FOLLOW-UP You should see your doctor in the office for a follow-up appointment approximately 2-3 weeks after your surgery.  You should have been given your post-op/follow-up appointment when your surgery was scheduled.  If you did not receive a post-op/follow-up appointment, make sure that you call for this appointment within a day or two after you arrive home to insure a convenient appointment time.  OTHER INSTRUCTIONS WEAR ABDOMINAL BINDER DURING DAYTIME  WHEN TO CALL YOUR DOCTOR: Fever over 101.0  Inability to urinate Continued bleeding from incision. Increased pain, redness, or drainage from the incision. Increasing abdominal pain  The clinic staff is available to answer your questions during regular business hours.  Please don't  hesitate to call and ask to speak to one of the nurses for clinical concerns.  If you have a medical emergency, go to the nearest emergency room or call 911.  A surgeon from [facility] is always on call at the hospital. [address] [place], [place], [place]  [address] ? [address], [place], [place]   [id number] [phone] ? [phone] ? FAX [phone] Web site: [email or web address]     Managing Your Pain After Surgery Without Opioids    Thank you for participating in our program to help patients manage their pain after surgery without opioids. This is part of our effort to provide you with the best care possible, without exposing you or your family to the risk that opioids pose.  What pain can I expect after surgery? You can expect to have some pain after surgery. This is normal. The pain is typically worse the day after surgery, and quickly begins to get better. Many studies have found that many patients are able to manage their pain after surgery with Over-the-Counter (OTC) medications such as Tylenol and Motrin. If you have a condition that does not allow you to take Tylenol or Motrin, notify your surgical team.  How will I manage my pain? The best strategy for controlling your pain after surgery is around the clock pain control with Tylenol (acetaminophen) and Motrin (ibuprofen or Advil). Alternating these medications with each other allows you to maximize your pain control. In addition to Tylenol and Motrin, you can use heating pads or ice packs on your incisions to help reduce your pain.  How will I alternate your regular strength over-the-counter pain medication? You will take a dose of pain medication every three hours. Start by taking 650 mg of Tylenol (2 pills of 325 mg) 3 hours later take 600 mg of Motrin (3 pills of 200 mg) 3 hours after taking the Motrin take 650 mg of Tylenol 3 hours after that take 600 mg of Motrin.   - 1 -  See  example - if your first dose of Tylenol is at 12:00 PM   12:00 PM Tylenol 650 mg (2 pills of 325 mg)  3:00 PM Motrin 600 mg (3 pills of 200 mg)  6:00 PM Tylenol 650 mg (2 pills of 325 mg)  9:00 PM Motrin 600 mg (3 pills of 200 mg)  Continue alternating every 3 hours   We recommend that you follow this schedule around-the-clock for at least 3 days after surgery, or until you feel that it is no longer needed. Use the table on the last page of this handout to keep track of the medications you are taking. Important: Do not take more than '3000mg'$  of Tylenol or '1600mg'$  of Motrin in a 24-hour period. Do not take ibuprofen/Motrin if you have a history of bleeding stomach ulcers, severe kidney disease, &/or actively taking a blood thinner  What if I still have pain? If you have pain that is not controlled with the over-the-counter pain medications (Tylenol and Motrin or Advil) you might have what we call "breakthrough" pain. You will receive a prescription for a small amount of an opioid pain medication such as Oxycodone, Tramadol, or Tylenol with Codeine. Use these opioid pills in the first 24 hours after surgery  if you have breakthrough pain. Do not take more than 1 pill every 4-6 hours.  If you still have uncontrolled pain after using all opioid pills, don't hesitate to call our staff using the number provided. We will help make sure you are managing your pain in the best way possible, and if necessary, we can provide a prescription for additional pain medication.   Day 1    Time  Name of Medication Number of pills taken  Amount of Acetaminophen  Pain Level   Comments  AM PM       AM PM       AM PM       AM PM       AM PM       AM PM       AM PM       AM PM       Total Daily amount of Acetaminophen Do not take more than  3,000 mg per day      Day 2    Time  Name of Medication Number of pills taken  Amount of Acetaminophen  Pain Level   Comments  AM PM       AM PM       AM  PM       AM PM       AM PM       AM PM       AM PM       AM PM       Total Daily amount of Acetaminophen Do not take more than  3,000 mg per day      Day 3    Time  Name of Medication Number of pills taken  Amount of Acetaminophen  Pain Level   Comments  AM PM       AM PM       AM PM       AM PM         AM PM       AM PM       AM PM       AM PM       Total Daily amount of Acetaminophen Do not take more than  3,000 mg per day      Day 4    Time  Name of Medication Number of pills taken  Amount of Acetaminophen  Pain Level   Comments  AM PM       AM PM       AM PM       AM PM       AM PM       AM PM       AM PM       AM PM       Total Daily amount of Acetaminophen Do not take more than  3,000 mg per day      Day 5    Time  Name of Medication Number of pills taken  Amount of Acetaminophen  Pain Level   Comments  AM PM       AM PM       AM PM       AM PM       AM PM       AM PM       AM PM       AM PM       Total Daily amount of Acetaminophen Do  not take more than  3,000 mg per day      Day 6    Time  Name of Medication Number of pills taken  Amount of Acetaminophen  Pain Level  Comments  AM PM       AM PM       AM PM       AM PM       AM PM       AM PM       AM PM       AM PM       Total Daily amount of Acetaminophen Do not take more than  3,000 mg per day      Day 7    Time  Name of Medication Number of pills taken  Amount of Acetaminophen  Pain Level   Comments  AM PM       AM PM       AM PM       AM PM       AM PM       AM PM       AM PM       AM PM       Total Daily amount of Acetaminophen Do not take more than  3,000 mg per day        For additional information about how and where to safely dispose of unused opioid medications - [employer]  Disclaimer: This document contains information and/or instructional materials adapted from [place] for the typical patient with  your condition. It does not replace medical advice from your health care provider because your experience may differ from that of the typical patient. Talk to your health care provider if you have any questions about this document, your condition or your treatment plan. Adapted from [place]

## 2022-12-11 NOTE — Anesthesia Procedure Notes (Addendum)
Procedure Name: Intubation Date/Time: 12/11/2022 2:16 PM  Performed by: Maxwell Caul, CRNAPre-anesthesia Checklist: Patient identified, Emergency Drugs available, Suction available and Patient being monitored Patient Re-evaluated:Patient Re-evaluated prior to induction Oxygen Delivery Method: Circle system utilized Preoxygenation: Pre-oxygenation with 100% oxygen Induction Type: IV induction, Rapid sequence and Cricoid Pressure applied Laryngoscope Size: Mac and 4 Grade View: Grade III Tube type: Oral Tube size: 7.5 mm Number of attempts: 1 Airway Equipment and Method: Stylet Placement Confirmation: ETT inserted through vocal cords under direct vision, positive ETCO2 and breath sounds checked- equal and bilateral Secured at: 21 cm Tube secured with: Tape Dental Injury: Teeth and Oropharynx as per pre-operative assessment

## 2022-12-11 NOTE — Anesthesia Preprocedure Evaluation (Addendum)
Anesthesia Evaluation  Patient identified by MRN, date of birth, ID band Patient awake    Reviewed: Allergy & Precautions, H&P , NPO status , Patient's Chart, lab work & pertinent test results  Airway Mallampati: II  TM Distance: >3 FB Neck ROM: Full    Dental no notable dental hx.    Pulmonary sleep apnea and Continuous Positive Airway Pressure Ventilation    Pulmonary exam normal breath sounds clear to auscultation       Cardiovascular negative cardio ROS Normal cardiovascular exam Rhythm:Regular Rate:Normal     Neuro/Psych negative neurological ROS  negative psych ROS   GI/Hepatic negative GI ROS, Neg liver ROS,,,  Endo/Other  negative endocrine ROS    Renal/GU negative Renal ROS  negative genitourinary   Musculoskeletal negative musculoskeletal ROS (+)    Abdominal   Peds negative pediatric ROS (+)  Hematology negative hematology ROS (+)   Anesthesia Other Findings   Reproductive/Obstetrics negative OB ROS                             Anesthesia Physical Anesthesia Plan  ASA: 2  Anesthesia Plan: General   Post-op Pain Management: Tylenol PO (pre-op)* and Toradol IV (intra-op)*   Induction: Intravenous  PONV Risk Score and Plan: 4 or greater and Ondansetron, Dexamethasone, Midazolam and Scopolamine patch - Pre-op  Airway Management Planned: Oral ETT  Additional Equipment:   Intra-op Plan:   Post-operative Plan: Extubation in OR  Informed Consent: I have reviewed the patients History and Physical, chart, labs and discussed the procedure including the risks, benefits and alternatives for the proposed anesthesia with the patient or authorized representative who has indicated his/her understanding and acceptance.     Dental advisory given  Plan Discussed with: CRNA and Surgeon  Anesthesia Plan Comments:        Anesthesia Quick Evaluation

## 2022-12-12 ENCOUNTER — Encounter (HOSPITAL_COMMUNITY): Payer: Self-pay | Admitting: General Surgery

## 2022-12-15 ENCOUNTER — Encounter: Payer: 59 | Admitting: Family Medicine

## 2022-12-26 DIAGNOSIS — Z8719 Personal history of other diseases of the digestive system: Secondary | ICD-10-CM | POA: Diagnosis not present

## 2022-12-26 DIAGNOSIS — Z9889 Other specified postprocedural states: Secondary | ICD-10-CM | POA: Diagnosis not present

## 2022-12-26 DIAGNOSIS — K420 Umbilical hernia with obstruction, without gangrene: Secondary | ICD-10-CM | POA: Diagnosis not present

## 2023-01-26 ENCOUNTER — Encounter: Payer: 59 | Admitting: Family Medicine

## 2023-02-25 DIAGNOSIS — K420 Umbilical hernia with obstruction, without gangrene: Secondary | ICD-10-CM | POA: Diagnosis not present

## 2023-02-25 DIAGNOSIS — Z9889 Other specified postprocedural states: Secondary | ICD-10-CM | POA: Diagnosis not present

## 2023-02-25 DIAGNOSIS — Z8719 Personal history of other diseases of the digestive system: Secondary | ICD-10-CM | POA: Diagnosis not present

## 2023-02-26 ENCOUNTER — Encounter: Payer: Self-pay | Admitting: Family Medicine

## 2023-02-26 ENCOUNTER — Ambulatory Visit (INDEPENDENT_AMBULATORY_CARE_PROVIDER_SITE_OTHER): Payer: 59 | Admitting: Family Medicine

## 2023-02-26 VITALS — BP 120/82 | HR 69 | Temp 98.7°F | Ht 73.0 in | Wt 280.4 lb

## 2023-02-26 DIAGNOSIS — R7989 Other specified abnormal findings of blood chemistry: Secondary | ICD-10-CM | POA: Diagnosis not present

## 2023-02-26 DIAGNOSIS — Z136 Encounter for screening for cardiovascular disorders: Secondary | ICD-10-CM | POA: Diagnosis not present

## 2023-02-26 DIAGNOSIS — Z0000 Encounter for general adult medical examination without abnormal findings: Secondary | ICD-10-CM | POA: Diagnosis not present

## 2023-02-26 DIAGNOSIS — Z6836 Body mass index (BMI) 36.0-36.9, adult: Secondary | ICD-10-CM | POA: Diagnosis not present

## 2023-02-26 DIAGNOSIS — Z131 Encounter for screening for diabetes mellitus: Secondary | ICD-10-CM

## 2023-02-26 DIAGNOSIS — R030 Elevated blood-pressure reading, without diagnosis of hypertension: Secondary | ICD-10-CM

## 2023-02-26 DIAGNOSIS — E669 Obesity, unspecified: Secondary | ICD-10-CM | POA: Diagnosis not present

## 2023-02-26 DIAGNOSIS — Z1329 Encounter for screening for other suspected endocrine disorder: Secondary | ICD-10-CM | POA: Diagnosis not present

## 2023-02-26 DIAGNOSIS — Z13220 Encounter for screening for lipoid disorders: Secondary | ICD-10-CM | POA: Diagnosis not present

## 2023-02-26 LAB — COMPREHENSIVE METABOLIC PANEL
ALT: 14 U/L (ref 0–53)
AST: 14 U/L (ref 0–37)
Albumin: 4.1 g/dL (ref 3.5–5.2)
Alkaline Phosphatase: 61 U/L (ref 39–117)
BUN: 10 mg/dL (ref 6–23)
CO2: 27 mEq/L (ref 19–32)
Calcium: 9.1 mg/dL (ref 8.4–10.5)
Chloride: 104 mEq/L (ref 96–112)
Creatinine, Ser: 0.98 mg/dL (ref 0.40–1.50)
GFR: 82.76 mL/min (ref >60.00)
Glucose, Bld: 91 mg/dL (ref 70–99)
Potassium: 3.9 mEq/L (ref 3.5–5.1)
Sodium: 138 mEq/L (ref 135–145)
Total Bilirubin: 0.5 mg/dL (ref 0.2–1.2)
Total Protein: 7 g/dL (ref 6.0–8.3)

## 2023-02-26 LAB — LIPID PANEL
Cholesterol: 160 mg/dL (ref 0–200)
HDL: 61.9 mg/dL (ref >39.00)
LDL Cholesterol: 89 mg/dL (ref 0–99)
NonHDL: 97.97
Total CHOL/HDL Ratio: 3
Triglycerides: 45 mg/dL (ref 0.0–149.0)
VLDL: 9 mg/dL (ref 0.0–40.0)

## 2023-02-26 LAB — HEMOGLOBIN A1C: Hgb A1c MFr Bld: 5.6 % (ref 4.6–6.5)

## 2023-02-26 LAB — TSH: TSH: 8.1 u[IU]/mL — ABNORMAL HIGH (ref 0.35–5.50)

## 2023-02-26 NOTE — Progress Notes (Signed)
Subjective:  Patient ID: Steven Silva, male    DOB: 04-25-1961  Age: 62 y.o. MRN: [id number]  CC:  Chief Complaint  Patient presents with   Annual Exam    HPI Steven Silva presents for Annual Exam  General surgeon, Dr. Andrey Campanile, incarcerated umbilical hernia repair with mesh on 12/11/2022.  Has been recovering well. No recent issues. Released yesterday at appt with surgeon.  Son is 9th grader at [employer] - scout camp at [facility] next weekend   Obstructive sleep apnea Treated with CPAP, working well with new mask.  Neuropathy with back issues - Dr. Conchita Paris, considering surgery. Using cane for stability.    History of slight elevation of blood pressure as well as elevated TSH in the past.  No current thyroid meds, or antihypertensives. Home readings 130-134/80's.  BP 134/78., brother with HTN.  No FH of early heart disease. Mat GF with MI in mid 60's.  Lab Results  Component Value Date   TSH 8.10 (H) 02/26/2023  Taking medication daily.  No new hot or cold intolerance. No new hair or skin changes, heart palpitations or new fatigue. Has gained weight - recent surgery as above. Plans increased exercise.   Wt Readings from Last 3 Encounters:  02/26/23 280 lb 6.4 oz (127.2 kg)  12/11/22 278 lb 6.4 oz (126.3 kg)  12/05/22 278 lb 6.4 oz (126.3 kg)  Body mass index is 36.99 kg/m. Weight 282 in October 2023.  Has been watching diet. Sedentary job.   BP Readings from Last 3 Encounters:  02/26/23 120/82  12/11/22 (!) 142/88  12/05/22 (!) 156/91   Lab Results  Component Value Date   WBC 6.8 12/05/2022   HGB 15.1 12/05/2022   HCT 45.5 12/05/2022   MCV 90.8 12/05/2022   PLT 248 12/05/2022        02/26/2023    9:25 AM 09/15/2022    1:26 PM 12/07/2019   10:44 AM 01/28/2019    2:25 PM 08/20/2018    1:40 PM  Depression screen PHQ 2/9  Decreased Interest 0 1 0 0 0  Down, Depressed, Hopeless 0 0 0 0 0  PHQ - 2 Score 0 1 0 0 0  Altered sleeping 0 0     Tired,  decreased energy 1 1     Change in appetite 1 1     Feeling bad or failure about yourself  0 0     Trouble concentrating 0 0     Moving slowly or fidgety/restless 0 0     Suicidal thoughts 0 0     PHQ-9 Score 2 3     Difficult doing work/chores Not difficult at all        Health Maintenance  Topic Date Due   COVID-19 Vaccine (1) Never done   DTaP/Tdap/Td (1 - Tdap) Never done   Zoster Vaccines- Shingrix (1 of 2) Never done   COLONOSCOPY (Pts 45-54yrs Insurance coverage will need to be confirmed)  12/07/2023   Hepatitis C Screening  Completed   HIV Screening  Completed   HPV VACCINES  Aged Out   INFLUENZA VACCINE  Discontinued  Colonoscopy 12/06/2018, repeat 5 years Prostate: does not have family history of prostate cancer The natural history of prostate cancer and ongoing controversy regarding screening and potential treatment outcomes of prostate cancer has been discussed with the patient. The meaning of a false positive PSA and a false negative PSA has been discussed. He indicates understanding of the limitations of this  screening test and wishes to proceed with screening PSA testing. Lab Results  Component Value Date   PSA1 0.7 01/28/2019   Immunization History  Administered Date(s) Administered   PPD Test 05/11/2013  COVID-vaccine booster - 3 vaccines total, declines further  RSV vaccine - declines for now.  Tdap - last one in 2017 or 2018.  Shingrix - plans on nurse visit.  Flu vaccine last year - at pharmacy.   No results found. Has glasses. Plans on optho visit soon.   Dental:Within Last 6 months  Alcohol: 1-2 per week.   Tobacco: none.   Exercise: minimal at present. Plans to increase.    History Patient Active Problem List   Diagnosis Date Noted   Abscess of buttock, right 01/12/2016   Past Medical History:  Diagnosis Date   Neuromuscular disorder    neuropathy from spinal surgery    Sleep apnea    wears cpap , no 02   Past Surgical History:   Procedure Laterality Date   COLONOSCOPY     INCISION AND DRAINAGE ABSCESS Left 01/12/2016   Procedure: INCISION AND DRAINAGE ABSCESS buttock abscess;  Surgeon: Glenna Fellows, MD;  Location: [facility];  Service: General;  Laterality: Left;   KNEE ARTHROSCOPY Bilateral    x 5 total- 3 1 knee, 2 other - pt unsure which    SPINE SURGERY     ACDF C3-C7   UMBILICAL HERNIA REPAIR N/A 12/11/2022   Procedure: LAPAROSCOPIC ASSISTED INCARCERATED UMBILICAL HERNIA REPAIR WITH MESH;  Surgeon: Gaynelle Adu, MD;  Location: [facility];  Service: General;  Laterality: N/A;   No Known Allergies Prior to Admission medications   Medication Sig Start Date End Date Taking? Authorizing Provider  oxyCODONE (OXY IR/ROXICODONE) 5 MG immediate release tablet Take 1 tablet (5 mg total) by mouth every 6 (six) hours as needed for severe pain. 12/11/22   Gaynelle Adu, MD   Social History   Socioeconomic History   Marital status: Married    Spouse name: Not on file   Number of children: Not on file   Years of education: Not on file   Highest education level: Not on file  Occupational History   Not on file  Tobacco Use   Smoking status: Never   Smokeless tobacco: Never  Vaping Use   Vaping Use: Never used  Substance and Sexual Activity   Alcohol use: Yes    Comment: occ beer  i beer a week   Drug use: No   Sexual activity: Yes  Other Topics Concern   Not on file  Social History Narrative   Not on file   Social Determinants of Health   Financial Resource Strain: Not on file  Food Insecurity: Not on file  Transportation Needs: Not on file  Physical Activity: Not on file  Stress: Not on file  Social Connections: Not on file  Intimate Partner Violence: Not on file    Review of Systems 13 point review of systems per patient health survey noted.  Negative other than as indicated above or in HPI.    Objective:   Vitals:   02/26/23 0922  BP: 120/82  Pulse: 69  Temp: 98.7 F (37.1 C)  TempSrc:  Temporal  SpO2: 97%  Weight: 280 lb 6.4 oz (127.2 kg)  Height: 6\' 1"  (1.854 m)     Physical Exam Vitals reviewed.  Constitutional:      Appearance: He is well-developed.  HENT:     Head: Normocephalic and atraumatic.  Right Ear: External ear normal.     Left Ear: External ear normal.  Eyes:     Conjunctiva/sclera: Conjunctivae normal.     Pupils: Pupils are equal, round, and reactive to light.  Neck:     Thyroid: No thyromegaly.  Cardiovascular:     Rate and Rhythm: Normal rate and regular rhythm.     Heart sounds: Normal heart sounds.  Pulmonary:     Effort: Pulmonary effort is normal. No respiratory distress.     Breath sounds: Normal breath sounds. No wheezing.  Abdominal:     General: There is no distension.     Palpations: Abdomen is soft.     Tenderness: There is no abdominal tenderness.  Musculoskeletal:        General: No tenderness. Normal range of motion.     Cervical back: Normal range of motion and neck supple.  Lymphadenopathy:     Cervical: No cervical adenopathy.  Skin:    General: Skin is warm and dry.  Neurological:     Mental Status: He is alert and oriented to person, place, and time.     Deep Tendon Reflexes: Reflexes are normal and symmetric.  Psychiatric:        Behavior: Behavior normal.     Assessment & Plan:  Steven Silva is a 62 y.o. male . Annual physical exam  - -anticipatory guidance as below in AVS, screening labs above. Health maintenance items as above in HPI discussed/recommended as applicable.   Screening for hyperlipidemia - Plan: Lipid panel  Screening for hypertension - Plan: Comprehensive metabolic panel  Screening for diabetes mellitus - Plan: Hemoglobin A1c  Screening for thyroid disorder - Plan: TSH  Elevated TSH - Plan: TSH  - repeat labs.   Blood pressure elevated without history of HTN - Plan: Comprehensive metabolic panel, Lipid panel  -Check labs, in office BP okay, monitor for elevations with RTC  precautions.  Class 2 obesity with body mass index (BMI) of 36.0 to 36.9 in adult, unspecified obesity type, unspecified whether serious comorbidity present  -Exercise, diet changes should help with weight.  Option of nurse visit for Shingrix.  No orders of the defined types were placed in this encounter.  Patient Instructions  Exercise, and watching diet should help with weight. I will check labs today. No new meds today but monitor blood pressure, and see info on hypertension below.  Schedule nurse visit for shingrix when convenient. Take care!  Hypertension, Adult High blood pressure (hypertension) is when the force of blood pumping through the arteries is too strong. The arteries are the blood vessels that carry blood from the heart throughout the body. Hypertension forces the heart to work harder to pump blood and may cause arteries to become narrow or stiff. Untreated or uncontrolled hypertension can lead to a heart attack, heart failure, a stroke, kidney disease, and other problems. A blood pressure reading consists of a higher number over a lower number. Ideally, your blood pressure should be below 120/80. The first ("top") number is called the systolic pressure. It is a measure of the pressure in your arteries as your heart beats. The second ("bottom") number is called the diastolic pressure. It is a measure of the pressure in your arteries as the heart relaxes. What are the causes? The exact cause of this condition is not known. There are some conditions that result in high blood pressure. What increases the risk? Certain factors may make you more likely to develop high blood pressure.  Some of these risk factors are under your control, including: Smoking. Not getting enough exercise or physical activity. Being overweight. Having too much fat, sugar, calories, or salt (sodium) in your diet. Drinking too much alcohol. Other risk factors include: Having a personal history of heart  disease, diabetes, high cholesterol, or kidney disease. Stress. Having a family history of high blood pressure and high cholesterol. Having obstructive sleep apnea. Age. The risk increases with age. What are the signs or symptoms? High blood pressure may not cause symptoms. Very high blood pressure (hypertensive crisis) may cause: Headache. Fast or irregular heartbeats (palpitations). Shortness of breath. Nosebleed. Nausea and vomiting. Vision changes. Severe chest pain, dizziness, and seizures. How is this diagnosed? This condition is diagnosed by measuring your blood pressure while you are seated, with your arm resting on a flat surface, your legs uncrossed, and your feet flat on the floor. The cuff of the blood pressure monitor will be placed directly against the skin of your upper arm at the level of your heart. Blood pressure should be measured at least twice using the same arm. Certain conditions can cause a difference in blood pressure between your right and left arms. If you have a high blood pressure reading during one visit or you have normal blood pressure with other risk factors, you may be asked to: Return on a different day to have your blood pressure checked again. Monitor your blood pressure at home for 1 week or longer. If you are diagnosed with hypertension, you may have other blood or imaging tests to help your health care provider understand your overall risk for other conditions. How is this treated? This condition is treated by making healthy lifestyle changes, such as eating healthy foods, exercising more, and reducing your alcohol intake. You may be referred for counseling on a healthy diet and physical activity. Your health care provider may prescribe medicine if lifestyle changes are not enough to get your blood pressure under control and if: Your systolic blood pressure is above 130. Your diastolic blood pressure is above 80. Your personal target blood pressure may  vary depending on your medical conditions, your age, and other factors. Follow these instructions at home: Eating and drinking  Eat a diet that is high in fiber and potassium, and low in sodium, added sugar, and fat. An example of this eating plan is called the DASH diet. DASH stands for Dietary Approaches to Stop Hypertension. To eat this way: Eat plenty of fresh fruits and vegetables. Try to fill one half of your plate at each meal with fruits and vegetables. Eat whole grains, such as whole-wheat pasta, brown rice, or whole-grain bread. Fill about one fourth of your plate with whole grains. Eat or drink low-fat dairy products, such as skim milk or low-fat yogurt. Avoid fatty cuts of meat, processed or cured meats, and poultry with skin. Fill about one fourth of your plate with lean proteins, such as fish, chicken without skin, beans, eggs, or tofu. Avoid pre-made and processed foods. These tend to be higher in sodium, added sugar, and fat. Reduce your daily sodium intake. Many people with hypertension should eat less than 1,500 mg of sodium a day. Do not drink alcohol if: Your health care provider tells you not to drink. You are pregnant, may be pregnant, or are planning to become pregnant. If you drink alcohol: Limit how much you have to: 0-1 drink a day for women. 0-2 drinks a day for men. Know how much alcohol is  in your drink. In the [place] one drink equals one 12 oz bottle of beer (355 mL), one 5 oz glass of wine (148 mL), or one 1 oz glass of hard liquor (44 mL). Lifestyle  Work with your health care provider to maintain a healthy body weight or to lose weight. Ask what an ideal weight is for you. Get at least 30 minutes of exercise that causes your heart to beat faster (aerobic exercise) most days of the week. Activities may include walking, swimming, or biking. Include exercise to strengthen your muscles (resistance exercise), such as Pilates or lifting weights, as part of your  weekly exercise routine. Try to do these types of exercises for 30 minutes at least 3 days a week. Do not use any products that contain nicotine or tobacco. These products include cigarettes, chewing tobacco, and vaping devices, such as e-cigarettes. If you need help quitting, ask your health care provider. Monitor your blood pressure at home as told by your health care provider. Keep all follow-up visits. This is important. Medicines Take over-the-counter and prescription medicines only as told by your health care provider. Follow directions carefully. Blood pressure medicines must be taken as prescribed. Do not skip doses of blood pressure medicine. Doing this puts you at risk for problems and can make the medicine less effective. Ask your health care provider about side effects or reactions to medicines that you should watch for. Contact a health care provider if you: Think you are having a reaction to a medicine you are taking. Have headaches that keep coming back (recurring). Feel dizzy. Have swelling in your ankles. Have trouble with your vision. Get help right away if you: Develop a severe headache or confusion. Have unusual weakness or numbness. Feel faint. Have severe pain in your chest or abdomen. Vomit repeatedly. Have trouble breathing. These symptoms may be an emergency. Get help right away. Call 911. Do not wait to see if the symptoms will go away. Do not drive yourself to the hospital. Summary Hypertension is when the force of blood pumping through your arteries is too strong. If this condition is not controlled, it may put you at risk for serious complications. Your personal target blood pressure may vary depending on your medical conditions, your age, and other factors. For most people, a normal blood pressure is less than 120/80. Hypertension is treated with lifestyle changes, medicines, or a combination of both. Lifestyle changes include losing weight, eating a healthy,  low-sodium diet, exercising more, and limiting alcohol. This information is not intended to replace advice given to you by your health care provider. Make sure you discuss any questions you have with your health care provider. Document Revised: 09/10/2021 Document Reviewed: 09/10/2021 Elsevier Patient Education  2023 [employer]   Preventive Care 5640-63 Years Old, Male Preventive care refers to lifestyle choices and visits with your health care provider that can promote health and wellness. Preventive care visits are also called wellness exams. What can I expect for my preventive care visit? Counseling During your preventive care visit, your health care provider may ask about your: Medical history, including: Past medical problems. Family medical history. Current health, including: Emotional well-being. Home life and relationship well-being. Sexual activity. Lifestyle, including: Alcohol, nicotine or tobacco, and drug use. Access to firearms. Diet, exercise, and sleep habits. Safety issues such as seatbelt and bike helmet use. Sunscreen use. Work and work [employer]. Physical exam Your health care provider will check your: Height and weight. These may be used to  calculate your BMI (body mass index). BMI is a measurement that tells if you are at a healthy weight. Waist circumference. This measures the distance around your waistline. This measurement also tells if you are at a healthy weight and may help predict your risk of certain diseases, such as type 2 diabetes and high blood pressure. Heart rate and blood pressure. Body temperature. Skin for abnormal spots. What immunizations do I need?  Vaccines are usually given at various ages, according to a schedule. Your health care provider will recommend vaccines for you based on your age, medical history, and lifestyle or other factors, such as travel or where you work. What tests do I need? Screening Your health care provider may  recommend screening tests for certain conditions. This may include: Lipid and cholesterol levels. Diabetes screening. This is done by checking your blood sugar (glucose) after you have not eaten for a while (fasting). Hepatitis B test. Hepatitis C test. HIV (human immunodeficiency virus) test. STI (sexually transmitted infection) testing, if you are at risk. Lung cancer screening. Prostate cancer screening. Colorectal cancer screening. Talk with your health care provider about your test results, treatment options, and if necessary, the need for more tests. Follow these instructions at home: Eating and drinking  Eat a diet that includes fresh fruits and vegetables, whole grains, lean protein, and low-fat dairy products. Take vitamin and mineral supplements as recommended by your health care provider. Do not drink alcohol if your health care provider tells you not to drink. If you drink alcohol: Limit how much you have to 0-2 drinks a day. Know how much alcohol is in your drink. In the [place] one drink equals one 12 oz bottle of beer (355 mL), one 5 oz glass of wine (148 mL), or one 1 oz glass of hard liquor (44 mL). Lifestyle Brush your teeth every morning and night with fluoride toothpaste. Floss one time each day. Exercise for at least 30 minutes 5 or more days each week. Do not use any products that contain nicotine or tobacco. These products include cigarettes, chewing tobacco, and vaping devices, such as e-cigarettes. If you need help quitting, ask your health care provider. Do not use drugs. If you are sexually active, practice safe sex. Use a condom or other form of protection to prevent STIs. Take aspirin only as told by your health care provider. Make sure that you understand how much to take and what form to take. Work with your health care provider to find out whether it is safe and beneficial for you to take aspirin daily. Find healthy ways to manage stress, such  as: Meditation, yoga, or listening to music. Journaling. Talking to a trusted person. Spending time with friends and family. Minimize exposure to UV radiation to reduce your risk of skin cancer. Safety Always wear your seat belt while driving or riding in a vehicle. Do not drive: If you have been drinking alcohol. Do not ride with someone who has been drinking. When you are tired or distracted. While texting. If you have been using any mind-altering substances or drugs. Wear a helmet and other protective equipment during sports activities. If you have firearms in your house, make sure you follow all gun safety procedures. What's next? Go to your health care provider once a year for an annual wellness visit. Ask your health care provider how often you should have your eyes and teeth checked. Stay up to date on all vaccines. This information is not intended to replace advice  given to you by your health care provider. Make sure you discuss any questions you have with your health care provider. Document Revised: 05/01/2021 Document Reviewed: 05/01/2021 Elsevier Patient Education  2023 [employer] Inc.      Signed,   Meredith Staggers, MD [facility], [facility] 02/27/23 4:06 PM

## 2023-02-26 NOTE — Patient Instructions (Addendum)
Exercise, and watching diet should help with weight. I will check labs today. No new meds today but monitor blood pressure, and see info on hypertension below.  Schedule nurse visit for shingrix when convenient. Take care!  Hypertension, Adult High blood pressure (hypertension) is when the force of blood pumping through the arteries is too strong. The arteries are the blood vessels that carry blood from the heart throughout the body. Hypertension forces the heart to work harder to pump blood and may cause arteries to become narrow or stiff. Untreated or uncontrolled hypertension can lead to a heart attack, heart failure, a stroke, kidney disease, and other problems. A blood pressure reading consists of a higher number over a lower number. Ideally, your blood pressure should be below 120/80. The first ("top") number is called the systolic pressure. It is a measure of the pressure in your arteries as your heart beats. The second ("bottom") number is called the diastolic pressure. It is a measure of the pressure in your arteries as the heart relaxes. What are the causes? The exact cause of this condition is not known. There are some conditions that result in high blood pressure. What increases the risk? Certain factors may make you more likely to develop high blood pressure. Some of these risk factors are under your control, including: Smoking. Not getting enough exercise or physical activity. Being overweight. Having too much fat, sugar, calories, or salt (sodium) in your diet. Drinking too much alcohol. Other risk factors include: Having a personal history of heart disease, diabetes, high cholesterol, or kidney disease. Stress. Having a family history of high blood pressure and high cholesterol. Having obstructive sleep apnea. Age. The risk increases with age. What are the signs or symptoms? High blood pressure may not cause symptoms. Very high blood pressure (hypertensive crisis) may  cause: Headache. Fast or irregular heartbeats (palpitations). Shortness of breath. Nosebleed. Nausea and vomiting. Vision changes. Severe chest pain, dizziness, and seizures. How is this diagnosed? This condition is diagnosed by measuring your blood pressure while you are seated, with your arm resting on a flat surface, your legs uncrossed, and your feet flat on the floor. The cuff of the blood pressure monitor will be placed directly against the skin of your upper arm at the level of your heart. Blood pressure should be measured at least twice using the same arm. Certain conditions can cause a difference in blood pressure between your right and left arms. If you have a high blood pressure reading during one visit or you have normal blood pressure with other risk factors, you may be asked to: Return on a different day to have your blood pressure checked again. Monitor your blood pressure at home for 1 week or longer. If you are diagnosed with hypertension, you may have other blood or imaging tests to help your health care provider understand your overall risk for other conditions. How is this treated? This condition is treated by making healthy lifestyle changes, such as eating healthy foods, exercising more, and reducing your alcohol intake. You may be referred for counseling on a healthy diet and physical activity. Your health care provider may prescribe medicine if lifestyle changes are not enough to get your blood pressure under control and if: Your systolic blood pressure is above 130. Your diastolic blood pressure is above 80. Your personal target blood pressure may vary depending on your medical conditions, your age, and other factors. Follow these instructions at home: Eating and drinking  Eat a diet that  is high in fiber and potassium, and low in sodium, added sugar, and fat. An example of this eating plan is called the DASH diet. DASH stands for Dietary Approaches to Stop  Hypertension. To eat this way: Eat plenty of fresh fruits and vegetables. Try to fill one half of your plate at each meal with fruits and vegetables. Eat whole grains, such as whole-wheat pasta, brown rice, or whole-grain bread. Fill about one fourth of your plate with whole grains. Eat or drink low-fat dairy products, such as skim milk or low-fat yogurt. Avoid fatty cuts of meat, processed or cured meats, and poultry with skin. Fill about one fourth of your plate with lean proteins, such as fish, chicken without skin, beans, eggs, or tofu. Avoid pre-made and processed foods. These tend to be higher in sodium, added sugar, and fat. Reduce your daily sodium intake. Many people with hypertension should eat less than 1,500 mg of sodium a day. Do not drink alcohol if: Your health care provider tells you not to drink. You are pregnant, may be pregnant, or are planning to become pregnant. If you drink alcohol: Limit how much you have to: 0-1 drink a day for women. 0-2 drinks a day for men. Know how much alcohol is in your drink. In the [place] one drink equals one 12 oz bottle of beer (355 mL), one 5 oz glass of wine (148 mL), or one 1 oz glass of hard liquor (44 mL). Lifestyle  Work with your health care provider to maintain a healthy body weight or to lose weight. Ask what an ideal weight is for you. Get at least 30 minutes of exercise that causes your heart to beat faster (aerobic exercise) most days of the week. Activities may include walking, swimming, or biking. Include exercise to strengthen your muscles (resistance exercise), such as Pilates or lifting weights, as part of your weekly exercise routine. Try to do these types of exercises for 30 minutes at least 3 days a week. Do not use any products that contain nicotine or tobacco. These products include cigarettes, chewing tobacco, and vaping devices, such as e-cigarettes. If you need help quitting, ask your health care provider. Monitor your  blood pressure at home as told by your health care provider. Keep all follow-up visits. This is important. Medicines Take over-the-counter and prescription medicines only as told by your health care provider. Follow directions carefully. Blood pressure medicines must be taken as prescribed. Do not skip doses of blood pressure medicine. Doing this puts you at risk for problems and can make the medicine less effective. Ask your health care provider about side effects or reactions to medicines that you should watch for. Contact a health care provider if you: Think you are having a reaction to a medicine you are taking. Have headaches that keep coming back (recurring). Feel dizzy. Have swelling in your ankles. Have trouble with your vision. Get help right away if you: Develop a severe headache or confusion. Have unusual weakness or numbness. Feel faint. Have severe pain in your chest or abdomen. Vomit repeatedly. Have trouble breathing. These symptoms may be an emergency. Get help right away. Call 911. Do not wait to see if the symptoms will go away. Do not drive yourself to the hospital. Summary Hypertension is when the force of blood pumping through your arteries is too strong. If this condition is not controlled, it may put you at risk for serious complications. Your personal target blood pressure may vary depending on your  medical conditions, your age, and other factors. For most people, a normal blood pressure is less than 120/80. Hypertension is treated with lifestyle changes, medicines, or a combination of both. Lifestyle changes include losing weight, eating a healthy, low-sodium diet, exercising more, and limiting alcohol. This information is not intended to replace advice given to you by your health care provider. Make sure you discuss any questions you have with your health care provider. Document Revised: 09/10/2021 Document Reviewed: 09/10/2021 Elsevier Patient Education  2023  [employer].   Preventive Care 64-55 Years Old, Male Preventive care refers to lifestyle choices and visits with your health care provider that can promote health and wellness. Preventive care visits are also called wellness exams. What can I expect for my preventive care visit? Counseling During your preventive care visit, your health care provider may ask about your: Medical history, including: Past medical problems. Family medical history. Current health, including: Emotional well-being. Home life and relationship well-being. Sexual activity. Lifestyle, including: Alcohol, nicotine or tobacco, and drug use. Access to firearms. Diet, exercise, and sleep habits. Safety issues such as seatbelt and bike helmet use. Sunscreen use. Work and work Astronomer. Physical exam Your health care provider will check your: Height and weight. These may be used to calculate your BMI (body mass index). BMI is a measurement that tells if you are at a healthy weight. Waist circumference. This measures the distance around your waistline. This measurement also tells if you are at a healthy weight and may help predict your risk of certain diseases, such as type 2 diabetes and high blood pressure. Heart rate and blood pressure. Body temperature. Skin for abnormal spots. What immunizations do I need?  Vaccines are usually given at various ages, according to a schedule. Your health care provider will recommend vaccines for you based on your age, medical history, and lifestyle or other factors, such as travel or where you work. What tests do I need? Screening Your health care provider may recommend screening tests for certain conditions. This may include: Lipid and cholesterol levels. Diabetes screening. This is done by checking your blood sugar (glucose) after you have not eaten for a while (fasting). Hepatitis B test. Hepatitis C test. HIV (human immunodeficiency virus) test. STI (sexually  transmitted infection) testing, if you are at risk. Lung cancer screening. Prostate cancer screening. Colorectal cancer screening. Talk with your health care provider about your test results, treatment options, and if necessary, the need for more tests. Follow these instructions at home: Eating and drinking  Eat a diet that includes fresh fruits and vegetables, whole grains, lean protein, and low-fat dairy products. Take vitamin and mineral supplements as recommended by your health care provider. Do not drink alcohol if your health care provider tells you not to drink. If you drink alcohol: Limit how much you have to 0-2 drinks a day. Know how much alcohol is in your drink. In the [place] one drink equals one 12 oz bottle of beer (355 mL), one 5 oz glass of wine (148 mL), or one 1 oz glass of hard liquor (44 mL). Lifestyle Brush your teeth every morning and night with fluoride toothpaste. Floss one time each day. Exercise for at least 30 minutes 5 or more days each week. Do not use any products that contain nicotine or tobacco. These products include cigarettes, chewing tobacco, and vaping devices, such as e-cigarettes. If you need help quitting, ask your health care provider. Do not use drugs. If you are sexually active, practice safe  sex. Use a condom or other form of protection to prevent STIs. Take aspirin only as told by your health care provider. Make sure that you understand how much to take and what form to take. Work with your health care provider to find out whether it is safe and beneficial for you to take aspirin daily. Find healthy ways to manage stress, such as: Meditation, yoga, or listening to music. Journaling. Talking to a trusted person. Spending time with friends and family. Minimize exposure to UV radiation to reduce your risk of skin cancer. Safety Always wear your seat belt while driving or riding in a vehicle. Do not drive: If you have been drinking alcohol. Do  not ride with someone who has been drinking. When you are tired or distracted. While texting. If you have been using any mind-altering substances or drugs. Wear a helmet and other protective equipment during sports activities. If you have firearms in your house, make sure you follow all gun safety procedures. What's next? Go to your health care provider once a year for an annual wellness visit. Ask your health care provider how often you should have your eyes and teeth checked. Stay up to date on all vaccines. This information is not intended to replace advice given to you by your health care provider. Make sure you discuss any questions you have with your health care provider. Document Revised: 05/01/2021 Document Reviewed: 05/01/2021 Elsevier Patient Education  2023 [employer]

## 2023-05-04 ENCOUNTER — Telehealth: Payer: Self-pay

## 2023-05-04 NOTE — Telephone Encounter (Signed)
Placed in your to be signed folder patient seems to have filled out majority of this form

## 2023-05-04 NOTE — Telephone Encounter (Signed)
Patient dropped off document  [employer] form , to be filled out by provider. Patient requested to send it via Call Patient to pick up within 5-days. Document is located in providers tray at front office.Please advise at Mobile [phone] (mobile)

## 2023-05-05 NOTE — Telephone Encounter (Signed)
Form reviewed. Paperwork completed and will be placed in fax bin at back nurse station on 05/06/23   Immunization History  Administered Date(s) Administered   PPD Test 05/11/2013

## 2023-05-06 NOTE — Telephone Encounter (Signed)
Collected from fax folder and placed in hold folder at my desk, called patient to ask how he would like [place] to proceed with this but no answer Left messaeg to call back  Need to know if patient wants to pick this up or if we should be emailing or faxing this somewhere?

## 2023-08-28 ENCOUNTER — Ambulatory Visit: Payer: Self-pay | Admitting: Family Medicine

## 2023-08-28 ENCOUNTER — Encounter: Payer: Self-pay | Admitting: Family Medicine

## 2024-04-20 DIAGNOSIS — M48062 Spinal stenosis, lumbar region with neurogenic claudication: Secondary | ICD-10-CM | POA: Diagnosis not present

## 2024-04-20 DIAGNOSIS — Z6836 Body mass index (BMI) 36.0-36.9, adult: Secondary | ICD-10-CM | POA: Diagnosis not present

## 2024-04-20 DIAGNOSIS — G959 Disease of spinal cord, unspecified: Secondary | ICD-10-CM | POA: Diagnosis not present

## 2024-04-22 ENCOUNTER — Other Ambulatory Visit: Payer: Self-pay | Admitting: Neurosurgery

## 2024-04-22 DIAGNOSIS — M48062 Spinal stenosis, lumbar region with neurogenic claudication: Secondary | ICD-10-CM

## 2024-04-22 DIAGNOSIS — G959 Disease of spinal cord, unspecified: Secondary | ICD-10-CM

## 2024-04-25 ENCOUNTER — Encounter: Payer: Self-pay | Admitting: Neurosurgery

## 2024-05-07 ENCOUNTER — Ambulatory Visit
Admission: RE | Admit: 2024-05-07 | Discharge: 2024-05-07 | Disposition: A | Discharge status: Home / Self Care | Source: Ambulatory Visit | Attending: Neurosurgery | Admitting: Neurosurgery

## 2024-05-07 DIAGNOSIS — M5146 Schmorl's nodes, lumbar region: Secondary | ICD-10-CM | POA: Diagnosis not present

## 2024-05-07 DIAGNOSIS — G959 Disease of spinal cord, unspecified: Secondary | ICD-10-CM

## 2024-05-07 DIAGNOSIS — Z981 Arthrodesis status: Secondary | ICD-10-CM | POA: Diagnosis not present

## 2024-05-07 DIAGNOSIS — M4802 Spinal stenosis, cervical region: Secondary | ICD-10-CM | POA: Diagnosis not present

## 2024-05-07 DIAGNOSIS — M48062 Spinal stenosis, lumbar region with neurogenic claudication: Secondary | ICD-10-CM

## 2024-05-07 DIAGNOSIS — M48061 Spinal stenosis, lumbar region without neurogenic claudication: Secondary | ICD-10-CM | POA: Diagnosis not present

## 2024-05-25 DIAGNOSIS — M48062 Spinal stenosis, lumbar region with neurogenic claudication: Secondary | ICD-10-CM | POA: Diagnosis not present

## 2024-05-25 DIAGNOSIS — Z6836 Body mass index (BMI) 36.0-36.9, adult: Secondary | ICD-10-CM | POA: Diagnosis not present

## 2024-06-28 DIAGNOSIS — M48062 Spinal stenosis, lumbar region with neurogenic claudication: Secondary | ICD-10-CM | POA: Diagnosis not present

## 2024-07-20 DIAGNOSIS — M48062 Spinal stenosis, lumbar region with neurogenic claudication: Secondary | ICD-10-CM | POA: Diagnosis not present

## 2024-08-05 DIAGNOSIS — M48061 Spinal stenosis, lumbar region without neurogenic claudication: Secondary | ICD-10-CM | POA: Diagnosis not present

## 2024-08-08 DIAGNOSIS — M48061 Spinal stenosis, lumbar region without neurogenic claudication: Secondary | ICD-10-CM | POA: Diagnosis not present

## 2024-08-09 ENCOUNTER — Encounter: Payer: Self-pay | Admitting: Internal Medicine

## 2024-08-15 DIAGNOSIS — M48061 Spinal stenosis, lumbar region without neurogenic claudication: Secondary | ICD-10-CM | POA: Diagnosis not present

## 2024-08-16 DIAGNOSIS — M48061 Spinal stenosis, lumbar region without neurogenic claudication: Secondary | ICD-10-CM | POA: Diagnosis not present

## 2024-08-25 DIAGNOSIS — M48061 Spinal stenosis, lumbar region without neurogenic claudication: Secondary | ICD-10-CM | POA: Diagnosis not present

## 2024-09-02 DIAGNOSIS — M48061 Spinal stenosis, lumbar region without neurogenic claudication: Secondary | ICD-10-CM | POA: Diagnosis not present

## 2024-09-05 DIAGNOSIS — M48061 Spinal stenosis, lumbar region without neurogenic claudication: Secondary | ICD-10-CM | POA: Diagnosis not present

## 2024-09-22 DIAGNOSIS — M48061 Spinal stenosis, lumbar region without neurogenic claudication: Secondary | ICD-10-CM | POA: Diagnosis not present

## 2024-10-17 DIAGNOSIS — M48062 Spinal stenosis, lumbar region with neurogenic claudication: Secondary | ICD-10-CM | POA: Diagnosis not present

## 2024-10-17 DIAGNOSIS — Z6836 Body mass index (BMI) 36.0-36.9, adult: Secondary | ICD-10-CM | POA: Diagnosis not present

## 2024-12-14 ENCOUNTER — Other Ambulatory Visit: Payer: Self-pay | Admitting: Neurosurgery

## 2024-12-29 ENCOUNTER — Other Ambulatory Visit (HOSPITAL_COMMUNITY)

## 2025-01-02 ENCOUNTER — Ambulatory Visit (HOSPITAL_COMMUNITY): Admission: RE | Admit: 2025-01-02 | Source: Home / Self Care | Admitting: Neurosurgery

## 2025-01-02 ENCOUNTER — Encounter (HOSPITAL_COMMUNITY): Admission: RE | Payer: Self-pay | Source: Home / Self Care
